# Patient Record
Sex: Male | Born: 1960 | Race: White | Hispanic: No | Marital: Married | Smoking: Never smoker
Health system: Southern US, Community
[De-identification: ages and names within clinical notes are randomized; demographics above are authoritative.]

## PROBLEM LIST (undated history)

## (undated) DIAGNOSIS — Q798 Other congenital malformations of musculoskeletal system: Secondary | ICD-10-CM

## (undated) DIAGNOSIS — E119 Type 2 diabetes mellitus without complications: Secondary | ICD-10-CM

## (undated) DIAGNOSIS — F1910 Other psychoactive substance abuse, uncomplicated: Secondary | ICD-10-CM

## (undated) DIAGNOSIS — M1990 Unspecified osteoarthritis, unspecified site: Secondary | ICD-10-CM

## (undated) DIAGNOSIS — I10 Essential (primary) hypertension: Secondary | ICD-10-CM

## (undated) HISTORY — PX: EYE SURGERY: SHX253

## (undated) HISTORY — PX: COLONOSCOPY: SHX174

## (undated) HISTORY — DX: Other congenital malformations of musculoskeletal system: Q79.8

## (undated) HISTORY — PX: EVALUATION UNDER ANESTHESIA WITH STRABISMUS REPAIR: SHX5619

## (undated) HISTORY — DX: Unspecified osteoarthritis, unspecified site: M19.90

## (undated) HISTORY — PX: POLYPECTOMY: SHX149

## (undated) HISTORY — DX: Other psychoactive substance abuse, uncomplicated: F19.10

## (undated) HISTORY — DX: Essential (primary) hypertension: I10

## (undated) HISTORY — DX: Type 2 diabetes mellitus without complications: E11.9

---

## 1998-11-08 ENCOUNTER — Emergency Department (HOSPITAL_COMMUNITY): Admission: EM | Admit: 1998-11-08 | Discharge: 1998-11-08 | Payer: Self-pay | Admitting: Emergency Medicine

## 1998-11-08 ENCOUNTER — Encounter: Payer: Self-pay | Admitting: Emergency Medicine

## 2014-11-18 ENCOUNTER — Encounter: Payer: Self-pay | Admitting: Internal Medicine

## 2015-01-14 ENCOUNTER — Ambulatory Visit (AMBULATORY_SURGERY_CENTER): Payer: Self-pay | Admitting: *Deleted

## 2015-01-14 VITALS — Ht 67.0 in | Wt 152.0 lb

## 2015-01-14 DIAGNOSIS — Z1211 Encounter for screening for malignant neoplasm of colon: Secondary | ICD-10-CM

## 2015-01-14 MED ORDER — NA SULFATE-K SULFATE-MG SULF 17.5-3.13-1.6 GM/177ML PO SOLN: 1.0000 | Freq: Once | ORAL | Status: DC

## 2015-01-14 NOTE — Progress Notes (Signed)
No egg or soy allergy. No anesthesia problems. Only sedated as child. No Home O2.  No diet meds.

## 2015-01-28 ENCOUNTER — Encounter: Payer: Self-pay | Admitting: Internal Medicine

## 2015-01-28 ENCOUNTER — Ambulatory Visit (AMBULATORY_SURGERY_CENTER): Payer: BLUE CROSS/BLUE SHIELD | Admitting: Internal Medicine

## 2015-01-28 VITALS — BP 166/98 | HR 54 | Temp 96.8°F | Resp 17 | Ht 67.0 in | Wt 152.0 lb

## 2015-01-28 DIAGNOSIS — K635 Polyp of colon: Secondary | ICD-10-CM

## 2015-01-28 DIAGNOSIS — Z1211 Encounter for screening for malignant neoplasm of colon: Secondary | ICD-10-CM | POA: Diagnosis present

## 2015-01-28 DIAGNOSIS — D125 Benign neoplasm of sigmoid colon: Secondary | ICD-10-CM

## 2015-01-28 DIAGNOSIS — D123 Benign neoplasm of transverse colon: Secondary | ICD-10-CM

## 2015-01-28 MED ORDER — SODIUM CHLORIDE 0.9 % IV SOLN: 500.0000 mL | INTRAVENOUS | Status: DC

## 2015-01-28 NOTE — Progress Notes (Signed)
Called to room to assist during endoscopic procedure.  Patient ID and intended procedure confirmed with present staff. Received instructions for my participation in the procedure from the performing physician.  

## 2015-01-28 NOTE — Op Note (Signed)
[facility]  [address] [place], [id number]   COLONOSCOPY PROCEDURE REPORT  PATIENT: Dustin Allen, Dustin Allen  MR#: [id number] BIRTHDATE: 18-Apr-1961 , 53  yrs. old GENDER: male ENDOSCOPIST: Jerene Bears, MD REFERRED BY:W.  Lutricia Feil, M.D. PROCEDURE DATE:  01/28/2015 PROCEDURE:   Colonoscopy, screening and Colonoscopy with cold biopsy polypectomy First Screening Colonoscopy - Avg.  risk and is 50 yrs.  old or older Yes.  Prior Negative Screening - Now for repeat screening. N/A  History of Adenoma - Now for follow-up colonoscopy & has been > or = to 3 yrs.  N/A  Polyps removed today? Yes ASA CLASS:   Class II INDICATIONS:Screening for colonic neoplasia and Colorectal Neoplasm Risk Assessment for this procedure is average risk. MEDICATIONS: Monitored anesthesia care and Propofol 250 mg IV  DESCRIPTION OF PROCEDURE:   After the risks benefits and alternatives of the procedure were thoroughly explained, informed consent was obtained.  The digital rectal exam revealed no rectal mass.   The LB [id number]  endoscope was introduced through the anus and advanced to the cecum, which was identified by both the appendix and ileocecal valve. No adverse events experienced. The quality of the prep was good.  (Suprep was used)  The instrument was then slowly withdrawn as the colon was fully examined. Estimated blood loss is zero unless otherwise noted in this procedure report.  COLON FINDINGS: Two sessile polyps ranging from 2 to 4mm in size were found at the hepatic flexure and in the sigmoid colon. Polypectomies were performed with cold forceps.  The resection was complete, the polyp tissue was completely retrieved and sent to histology.   There was moderate diverticulosis noted in the descending colon and sigmoid colon.  Retroflexed views revealed no abnormalities. The time to cecum = 0.0 Withdrawal time = 11.1   The scope was withdrawn and the procedure  completed. COMPLICATIONS: There were no immediate complications.  ENDOSCOPIC IMPRESSION: 1.   Two sessile polyps ranging from 2 to 85mm in size were found at the hepatic flexure and in the sigmoid colon; polypectomies were performed with cold forceps 2.   Moderate diverticulosis was noted in the descending colon and sigmoid colon  RECOMMENDATIONS: 1.  Await pathology results 2.  High fiber diet 3.  If the polyps removed today are proven to be adenomatous (pre-cancerous) polyps, you will need a repeat colonoscopy in 5 years.  Otherwise you should continue to follow colorectal cancer screening guidelines for "routine risk" patients with colonoscopy in 10 years.  You will receive a letter within 1-2 weeks with the results of your biopsy as well as final recommendations.  Please call my office if you have not received a letter after 3 weeks.  eSigned:  Jerene Bears, MD 01/28/2015 10:47 AM   cc: Janalyn Rouse, MD and The Patient

## 2015-01-28 NOTE — Patient Instructions (Signed)
YOU HAD AN ENDOSCOPIC PROCEDURE TODAY AT [facility]:   Refer to the procedure report that was given to you for any specific questions about what was found during the examination.  If the procedure report does not answer your questions, please call your gastroenterologist to clarify.  If you requested that your care partner not be given the details of your procedure findings, then the procedure report has been included in a sealed envelope for you to review at your convenience later.  YOU SHOULD EXPECT: Some feelings of bloating in the abdomen. Passage of more gas than usual.  Walking can help get rid of the air that was put into your GI tract during the procedure and reduce the bloating. If you had a lower endoscopy (such as a colonoscopy or flexible sigmoidoscopy) you may notice spotting of blood in your stool or on the toilet paper. If you underwent a bowel prep for your procedure, you may not have a normal bowel movement for a few days.  Please Note:  You might notice some irritation and congestion in your nose or some drainage.  This is from the oxygen used during your procedure.  There is no need for concern and it should clear up in a day or so.  SYMPTOMS TO REPORT IMMEDIATELY:   Following lower endoscopy (colonoscopy or flexible sigmoidoscopy):  Excessive amounts of blood in the stool  Significant tenderness or worsening of abdominal pains  Swelling of the abdomen that is new, acute  Fever of 100F or higher   For urgent or emergent issues, a gastroenterologist can be reached at any hour by calling [phone].   DIET: Your first meal following the procedure should be a small meal and then it is ok to progress to your normal diet. Heavy or fried foods are harder to digest and may make you feel nauseous or bloated.  Likewise, meals heavy in dairy and vegetables can increase bloating.  Drink plenty of fluids but you should avoid alcoholic beverages for 24  hours.  ACTIVITY:  You should plan to take it easy for the rest of today and you should NOT DRIVE or use heavy machinery until tomorrow (because of the sedation medicines used during the test).    FOLLOW UP: Our staff will call the number listed on your records the next business day following your procedure to check on you and address any questions or concerns that you may have regarding the information given to you following your procedure. If we do not reach you, we will leave a message.  However, if you are feeling well and you are not experiencing any problems, there is no need to return our call.  We will assume that you have returned to your regular daily activities without incident.  If any biopsies were taken you will be contacted by phone or by letter within the next 1-3 weeks.  Please call us at [phone] if you have not heard about the biopsies in 3 weeks.    SIGNATURES/CONFIDENTIALITY: You and/or your care partner have signed paperwork which will be entered into your electronic medical record.  These signatures attest to the fact that that the information above on your After Visit Summary has been reviewed and is understood.  Full responsibility of the confidentiality of this discharge information lies with you and/or your care-partner.    Handouts were given to your care partner on polyps, diverticulosis, and a high fiber diet with liberal fluid intake. You may resume  current medications today. Await biopsy results. Please call if any questions or concerns.   

## 2015-01-28 NOTE — Progress Notes (Signed)
A/ox3 pleased with MAC, report to Annette RN 

## 2015-01-28 NOTE — Progress Notes (Signed)
No problems noted in the recovery room. maw 

## 2015-01-29 ENCOUNTER — Telehealth: Payer: Self-pay | Admitting: *Deleted

## 2015-01-29 NOTE — Telephone Encounter (Signed)
  Follow up Call-  Call back number 01/28/2015  Post procedure Call Back phone  # [phone]  Permission to leave phone message Yes     Patient questions:  Do you have a fever, pain , or abdominal swelling? No. Pain Score  0 *  Have you tolerated food without any problems? Yes.    Have you been able to return to your normal activities? Yes.    Do you have any questions about your discharge instructions: Diet   No. Medications  No. Follow up visit  No.  Do you have questions or concerns about your Care? No.  Actions: * If pain score is 4 or above: No action needed, pain <4.

## 2015-02-03 ENCOUNTER — Encounter: Payer: Self-pay | Admitting: Internal Medicine

## 2015-10-28 DIAGNOSIS — E784 Other hyperlipidemia: Secondary | ICD-10-CM | POA: Diagnosis not present

## 2015-10-28 DIAGNOSIS — E119 Type 2 diabetes mellitus without complications: Secondary | ICD-10-CM | POA: Diagnosis not present

## 2015-10-28 DIAGNOSIS — Z6823 Body mass index (BMI) 23.0-23.9, adult: Secondary | ICD-10-CM | POA: Diagnosis not present

## 2015-10-28 DIAGNOSIS — I10 Essential (primary) hypertension: Secondary | ICD-10-CM | POA: Diagnosis not present

## 2015-11-13 DIAGNOSIS — L821 Other seborrheic keratosis: Secondary | ICD-10-CM | POA: Diagnosis not present

## 2015-11-13 DIAGNOSIS — D692 Other nonthrombocytopenic purpura: Secondary | ICD-10-CM | POA: Diagnosis not present

## 2015-11-13 DIAGNOSIS — B360 Pityriasis versicolor: Secondary | ICD-10-CM | POA: Diagnosis not present

## 2015-11-13 DIAGNOSIS — D1801 Hemangioma of skin and subcutaneous tissue: Secondary | ICD-10-CM | POA: Diagnosis not present

## 2015-12-05 DIAGNOSIS — Z125 Encounter for screening for malignant neoplasm of prostate: Secondary | ICD-10-CM | POA: Diagnosis not present

## 2015-12-05 DIAGNOSIS — E119 Type 2 diabetes mellitus without complications: Secondary | ICD-10-CM | POA: Diagnosis not present

## 2015-12-05 DIAGNOSIS — E784 Other hyperlipidemia: Secondary | ICD-10-CM | POA: Diagnosis not present

## 2015-12-05 DIAGNOSIS — I10 Essential (primary) hypertension: Secondary | ICD-10-CM | POA: Diagnosis not present

## 2015-12-08 DIAGNOSIS — R5383 Other fatigue: Secondary | ICD-10-CM | POA: Diagnosis not present

## 2016-01-01 DIAGNOSIS — M62831 Muscle spasm of calf: Secondary | ICD-10-CM | POA: Diagnosis not present

## 2016-01-01 DIAGNOSIS — M9906 Segmental and somatic dysfunction of lower extremity: Secondary | ICD-10-CM | POA: Diagnosis not present

## 2016-01-01 DIAGNOSIS — S86012A Strain of left Achilles tendon, initial encounter: Secondary | ICD-10-CM | POA: Diagnosis not present

## 2016-01-30 DIAGNOSIS — I10 Essential (primary) hypertension: Secondary | ICD-10-CM | POA: Diagnosis not present

## 2016-04-28 DIAGNOSIS — E1129 Type 2 diabetes mellitus with other diabetic kidney complication: Secondary | ICD-10-CM | POA: Diagnosis not present

## 2016-04-28 DIAGNOSIS — R808 Other proteinuria: Secondary | ICD-10-CM | POA: Diagnosis not present

## 2016-04-28 DIAGNOSIS — E784 Other hyperlipidemia: Secondary | ICD-10-CM | POA: Diagnosis not present

## 2016-04-28 DIAGNOSIS — I10 Essential (primary) hypertension: Secondary | ICD-10-CM | POA: Diagnosis not present

## 2016-04-28 DIAGNOSIS — Z23 Encounter for immunization: Secondary | ICD-10-CM | POA: Diagnosis not present

## 2016-09-01 DIAGNOSIS — E784 Other hyperlipidemia: Secondary | ICD-10-CM | POA: Diagnosis not present

## 2016-09-01 DIAGNOSIS — E1129 Type 2 diabetes mellitus with other diabetic kidney complication: Secondary | ICD-10-CM | POA: Diagnosis not present

## 2016-09-01 DIAGNOSIS — I10 Essential (primary) hypertension: Secondary | ICD-10-CM | POA: Diagnosis not present

## 2016-09-01 DIAGNOSIS — R808 Other proteinuria: Secondary | ICD-10-CM | POA: Diagnosis not present

## 2016-12-30 DIAGNOSIS — C44622 Squamous cell carcinoma of skin of right upper limb, including shoulder: Secondary | ICD-10-CM | POA: Diagnosis not present

## 2016-12-30 DIAGNOSIS — L821 Other seborrheic keratosis: Secondary | ICD-10-CM | POA: Diagnosis not present

## 2016-12-30 DIAGNOSIS — L812 Freckles: Secondary | ICD-10-CM | POA: Diagnosis not present

## 2017-02-17 ENCOUNTER — Emergency Department (HOSPITAL_COMMUNITY)
Admission: EM | Admit: 2017-02-17 | Discharge: 2017-02-17 | Disposition: A | Discharge status: Home / Self Care | Payer: BLUE CROSS/BLUE SHIELD | Attending: Emergency Medicine | Admitting: Emergency Medicine

## 2017-02-17 ENCOUNTER — Encounter (HOSPITAL_COMMUNITY): Payer: Self-pay | Admitting: Emergency Medicine

## 2017-02-17 ENCOUNTER — Emergency Department (HOSPITAL_COMMUNITY): Payer: BLUE CROSS/BLUE SHIELD

## 2017-02-17 DIAGNOSIS — Z7982 Long term (current) use of aspirin: Secondary | ICD-10-CM | POA: Diagnosis not present

## 2017-02-17 DIAGNOSIS — S99911A Unspecified injury of right ankle, initial encounter: Secondary | ICD-10-CM | POA: Diagnosis not present

## 2017-02-17 DIAGNOSIS — Z7984 Long term (current) use of oral hypoglycemic drugs: Secondary | ICD-10-CM | POA: Insufficient documentation

## 2017-02-17 DIAGNOSIS — Z79899 Other long term (current) drug therapy: Secondary | ICD-10-CM | POA: Insufficient documentation

## 2017-02-17 DIAGNOSIS — Z23 Encounter for immunization: Secondary | ICD-10-CM | POA: Diagnosis not present

## 2017-02-17 DIAGNOSIS — R079 Chest pain, unspecified: Secondary | ICD-10-CM | POA: Diagnosis not present

## 2017-02-17 DIAGNOSIS — M47812 Spondylosis without myelopathy or radiculopathy, cervical region: Secondary | ICD-10-CM | POA: Diagnosis not present

## 2017-02-17 DIAGNOSIS — I10 Essential (primary) hypertension: Secondary | ICD-10-CM | POA: Diagnosis not present

## 2017-02-17 DIAGNOSIS — R0789 Other chest pain: Secondary | ICD-10-CM | POA: Insufficient documentation

## 2017-02-17 DIAGNOSIS — F17220 Nicotine dependence, chewing tobacco, uncomplicated: Secondary | ICD-10-CM | POA: Diagnosis not present

## 2017-02-17 DIAGNOSIS — S99921A Unspecified injury of right foot, initial encounter: Secondary | ICD-10-CM | POA: Diagnosis not present

## 2017-02-17 DIAGNOSIS — R0781 Pleurodynia: Secondary | ICD-10-CM | POA: Diagnosis not present

## 2017-02-17 DIAGNOSIS — Z043 Encounter for examination and observation following other accident: Secondary | ICD-10-CM | POA: Diagnosis not present

## 2017-02-17 DIAGNOSIS — E119 Type 2 diabetes mellitus without complications: Secondary | ICD-10-CM | POA: Insufficient documentation

## 2017-02-17 MED ORDER — TETANUS-DIPHTH-ACELL PERTUSSIS 5-2.5-18.5 LF-MCG/0.5 IM SUSP
0.5000 mL | Freq: Once | INTRAMUSCULAR | Status: AC
  Administered 2017-02-17: 0.5 mL via INTRAMUSCULAR
  Filled 2017-02-17: qty 0.5
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
## 2017-02-17 NOTE — Discharge Instructions (Signed)
Take tylenol, motrin for pain.   Expect to be stiff and sore tomorrow.   See your doctor.   Return to ER if you have headaches, chest pain, rib pain, trouble breathing, foot pain
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
## 2017-02-17 NOTE — ED Notes (Signed)
Patient transported to X-ray 

## 2017-02-22 DIAGNOSIS — Z6827 Body mass index (BMI) 27.0-27.9, adult: Secondary | ICD-10-CM | POA: Diagnosis not present

## 2017-02-22 DIAGNOSIS — L237 Allergic contact dermatitis due to plants, except food: Secondary | ICD-10-CM | POA: Diagnosis not present

## 2017-02-22 DIAGNOSIS — Z23 Encounter for immunization: Secondary | ICD-10-CM | POA: Diagnosis not present

## 2017-02-22 DIAGNOSIS — R0781 Pleurodynia: Secondary | ICD-10-CM | POA: Diagnosis not present

## 2017-03-28 DIAGNOSIS — L603 Nail dystrophy: Secondary | ICD-10-CM | POA: Diagnosis not present

## 2017-03-28 DIAGNOSIS — Z85828 Personal history of other malignant neoplasm of skin: Secondary | ICD-10-CM | POA: Diagnosis not present

## 2017-05-18 DIAGNOSIS — B351 Tinea unguium: Secondary | ICD-10-CM | POA: Diagnosis not present

## 2017-05-18 DIAGNOSIS — Z79899 Other long term (current) drug therapy: Secondary | ICD-10-CM | POA: Diagnosis not present

## 2017-07-10 DIAGNOSIS — J069 Acute upper respiratory infection, unspecified: Secondary | ICD-10-CM | POA: Diagnosis not present

## 2017-07-10 DIAGNOSIS — E119 Type 2 diabetes mellitus without complications: Secondary | ICD-10-CM | POA: Diagnosis not present

## 2017-07-10 DIAGNOSIS — H109 Unspecified conjunctivitis: Secondary | ICD-10-CM | POA: Diagnosis not present

## 2017-08-09 DIAGNOSIS — R5383 Other fatigue: Secondary | ICD-10-CM | POA: Diagnosis not present

## 2017-08-09 DIAGNOSIS — E1129 Type 2 diabetes mellitus with other diabetic kidney complication: Secondary | ICD-10-CM | POA: Diagnosis not present

## 2017-08-09 DIAGNOSIS — E7849 Other hyperlipidemia: Secondary | ICD-10-CM | POA: Diagnosis not present

## 2017-08-09 DIAGNOSIS — I10 Essential (primary) hypertension: Secondary | ICD-10-CM | POA: Diagnosis not present

## 2017-08-15 DIAGNOSIS — E1129 Type 2 diabetes mellitus with other diabetic kidney complication: Secondary | ICD-10-CM | POA: Diagnosis not present

## 2017-08-15 DIAGNOSIS — I10 Essential (primary) hypertension: Secondary | ICD-10-CM | POA: Diagnosis not present

## 2017-08-15 DIAGNOSIS — E7849 Other hyperlipidemia: Secondary | ICD-10-CM | POA: Diagnosis not present

## 2017-08-15 DIAGNOSIS — Z0000 Encounter for general adult medical examination without abnormal findings: Secondary | ICD-10-CM | POA: Diagnosis not present

## 2017-08-15 DIAGNOSIS — R808 Other proteinuria: Secondary | ICD-10-CM | POA: Diagnosis not present

## 2017-09-27 DIAGNOSIS — S61431A Puncture wound without foreign body of right hand, initial encounter: Secondary | ICD-10-CM | POA: Diagnosis not present

## 2017-09-27 DIAGNOSIS — W540XXA Bitten by dog, initial encounter: Secondary | ICD-10-CM | POA: Diagnosis not present

## 2017-11-16 DIAGNOSIS — F430 Acute stress reaction: Secondary | ICD-10-CM | POA: Diagnosis not present

## 2017-11-16 DIAGNOSIS — F1020 Alcohol dependence, uncomplicated: Secondary | ICD-10-CM | POA: Diagnosis not present

## 2017-12-27 DIAGNOSIS — F1020 Alcohol dependence, uncomplicated: Secondary | ICD-10-CM | POA: Diagnosis not present

## 2017-12-27 DIAGNOSIS — F430 Acute stress reaction: Secondary | ICD-10-CM | POA: Diagnosis not present

## 2018-01-02 DIAGNOSIS — L812 Freckles: Secondary | ICD-10-CM | POA: Diagnosis not present

## 2018-01-02 DIAGNOSIS — B360 Pityriasis versicolor: Secondary | ICD-10-CM | POA: Diagnosis not present

## 2018-01-02 DIAGNOSIS — L821 Other seborrheic keratosis: Secondary | ICD-10-CM | POA: Diagnosis not present

## 2018-01-02 DIAGNOSIS — Z85828 Personal history of other malignant neoplasm of skin: Secondary | ICD-10-CM | POA: Diagnosis not present

## 2018-02-08 DIAGNOSIS — E1129 Type 2 diabetes mellitus with other diabetic kidney complication: Secondary | ICD-10-CM | POA: Diagnosis not present

## 2018-02-08 DIAGNOSIS — I10 Essential (primary) hypertension: Secondary | ICD-10-CM | POA: Diagnosis not present

## 2018-02-08 DIAGNOSIS — F1020 Alcohol dependence, uncomplicated: Secondary | ICD-10-CM | POA: Diagnosis not present

## 2018-02-08 DIAGNOSIS — Z23 Encounter for immunization: Secondary | ICD-10-CM | POA: Diagnosis not present

## 2018-02-08 DIAGNOSIS — E7849 Other hyperlipidemia: Secondary | ICD-10-CM | POA: Diagnosis not present

## 2018-03-14 DIAGNOSIS — L820 Inflamed seborrheic keratosis: Secondary | ICD-10-CM | POA: Diagnosis not present

## 2018-11-24 DIAGNOSIS — Z03818 Encounter for observation for suspected exposure to other biological agents ruled out: Secondary | ICD-10-CM | POA: Diagnosis not present

## 2019-01-04 DIAGNOSIS — L812 Freckles: Secondary | ICD-10-CM | POA: Diagnosis not present

## 2019-01-04 DIAGNOSIS — Z85828 Personal history of other malignant neoplasm of skin: Secondary | ICD-10-CM | POA: Diagnosis not present

## 2019-01-04 DIAGNOSIS — L820 Inflamed seborrheic keratosis: Secondary | ICD-10-CM | POA: Diagnosis not present

## 2019-01-04 DIAGNOSIS — L821 Other seborrheic keratosis: Secondary | ICD-10-CM | POA: Diagnosis not present

## 2019-01-04 DIAGNOSIS — B360 Pityriasis versicolor: Secondary | ICD-10-CM | POA: Diagnosis not present

## 2019-02-22 DIAGNOSIS — Z125 Encounter for screening for malignant neoplasm of prostate: Secondary | ICD-10-CM | POA: Diagnosis not present

## 2019-02-22 DIAGNOSIS — E1129 Type 2 diabetes mellitus with other diabetic kidney complication: Secondary | ICD-10-CM | POA: Diagnosis not present

## 2019-02-22 DIAGNOSIS — Z0000 Encounter for general adult medical examination without abnormal findings: Secondary | ICD-10-CM | POA: Diagnosis not present

## 2019-03-01 DIAGNOSIS — E1129 Type 2 diabetes mellitus with other diabetic kidney complication: Secondary | ICD-10-CM | POA: Diagnosis not present

## 2019-03-01 DIAGNOSIS — Z1331 Encounter for screening for depression: Secondary | ICD-10-CM | POA: Diagnosis not present

## 2019-03-01 DIAGNOSIS — E785 Hyperlipidemia, unspecified: Secondary | ICD-10-CM | POA: Diagnosis not present

## 2019-03-01 DIAGNOSIS — I129 Hypertensive chronic kidney disease with stage 1 through stage 4 chronic kidney disease, or unspecified chronic kidney disease: Secondary | ICD-10-CM | POA: Diagnosis not present

## 2019-03-01 DIAGNOSIS — Z0000 Encounter for general adult medical examination without abnormal findings: Secondary | ICD-10-CM | POA: Diagnosis not present

## 2019-03-01 DIAGNOSIS — N183 Chronic kidney disease, stage 3 (moderate): Secondary | ICD-10-CM | POA: Diagnosis not present

## 2019-07-09 DIAGNOSIS — D1801 Hemangioma of skin and subcutaneous tissue: Secondary | ICD-10-CM | POA: Diagnosis not present

## 2019-07-09 DIAGNOSIS — L812 Freckles: Secondary | ICD-10-CM | POA: Diagnosis not present

## 2019-07-09 DIAGNOSIS — L570 Actinic keratosis: Secondary | ICD-10-CM | POA: Diagnosis not present

## 2019-07-09 DIAGNOSIS — Z85828 Personal history of other malignant neoplasm of skin: Secondary | ICD-10-CM | POA: Diagnosis not present

## 2019-07-09 DIAGNOSIS — L821 Other seborrheic keratosis: Secondary | ICD-10-CM | POA: Diagnosis not present

## 2019-08-29 DIAGNOSIS — E1129 Type 2 diabetes mellitus with other diabetic kidney complication: Secondary | ICD-10-CM | POA: Diagnosis not present

## 2019-08-29 DIAGNOSIS — I10 Essential (primary) hypertension: Secondary | ICD-10-CM | POA: Diagnosis not present

## 2019-08-29 DIAGNOSIS — N1831 Chronic kidney disease, stage 3a: Secondary | ICD-10-CM | POA: Diagnosis not present

## 2019-08-29 DIAGNOSIS — E785 Hyperlipidemia, unspecified: Secondary | ICD-10-CM | POA: Diagnosis not present

## 2019-12-31 DIAGNOSIS — L812 Freckles: Secondary | ICD-10-CM | POA: Diagnosis not present

## 2019-12-31 DIAGNOSIS — D1801 Hemangioma of skin and subcutaneous tissue: Secondary | ICD-10-CM | POA: Diagnosis not present

## 2019-12-31 DIAGNOSIS — L820 Inflamed seborrheic keratosis: Secondary | ICD-10-CM | POA: Diagnosis not present

## 2019-12-31 DIAGNOSIS — D225 Melanocytic nevi of trunk: Secondary | ICD-10-CM | POA: Diagnosis not present

## 2019-12-31 DIAGNOSIS — Z85828 Personal history of other malignant neoplasm of skin: Secondary | ICD-10-CM | POA: Diagnosis not present

## 2020-04-02 DIAGNOSIS — F1020 Alcohol dependence, uncomplicated: Secondary | ICD-10-CM | POA: Diagnosis not present

## 2020-04-16 DIAGNOSIS — F1020 Alcohol dependence, uncomplicated: Secondary | ICD-10-CM | POA: Diagnosis not present

## 2020-04-30 DIAGNOSIS — F1020 Alcohol dependence, uncomplicated: Secondary | ICD-10-CM | POA: Diagnosis not present

## 2020-05-14 DIAGNOSIS — F1020 Alcohol dependence, uncomplicated: Secondary | ICD-10-CM | POA: Diagnosis not present

## 2020-06-03 ENCOUNTER — Other Ambulatory Visit: Payer: Self-pay

## 2020-06-03 ENCOUNTER — Encounter (HOSPITAL_BASED_OUTPATIENT_CLINIC_OR_DEPARTMENT_OTHER): Payer: Self-pay | Admitting: *Deleted

## 2020-06-03 ENCOUNTER — Other Ambulatory Visit (HOSPITAL_COMMUNITY): Payer: Self-pay | Admitting: Family

## 2020-06-03 ENCOUNTER — Emergency Department (HOSPITAL_BASED_OUTPATIENT_CLINIC_OR_DEPARTMENT_OTHER)
Admission: EM | Admit: 2020-06-03 | Discharge: 2020-06-03 | Disposition: A | Discharge status: Home / Self Care | Payer: BC Managed Care – PPO | Attending: Emergency Medicine | Admitting: Emergency Medicine

## 2020-06-03 DIAGNOSIS — R059 Cough, unspecified: Secondary | ICD-10-CM | POA: Diagnosis not present

## 2020-06-03 DIAGNOSIS — I10 Essential (primary) hypertension: Secondary | ICD-10-CM | POA: Diagnosis not present

## 2020-06-03 DIAGNOSIS — Z20822 Contact with and (suspected) exposure to covid-19: Secondary | ICD-10-CM | POA: Diagnosis not present

## 2020-06-03 DIAGNOSIS — R509 Fever, unspecified: Secondary | ICD-10-CM | POA: Diagnosis not present

## 2020-06-03 DIAGNOSIS — F17220 Nicotine dependence, chewing tobacco, uncomplicated: Secondary | ICD-10-CM | POA: Diagnosis not present

## 2020-06-03 DIAGNOSIS — U071 COVID-19: Secondary | ICD-10-CM

## 2020-06-03 DIAGNOSIS — Z7982 Long term (current) use of aspirin: Secondary | ICD-10-CM | POA: Diagnosis not present

## 2020-06-03 DIAGNOSIS — Z79899 Other long term (current) drug therapy: Secondary | ICD-10-CM | POA: Diagnosis not present

## 2020-06-03 DIAGNOSIS — E119 Type 2 diabetes mellitus without complications: Secondary | ICD-10-CM | POA: Insufficient documentation

## 2020-06-03 DIAGNOSIS — Z7984 Long term (current) use of oral hypoglycemic drugs: Secondary | ICD-10-CM | POA: Diagnosis not present

## 2020-06-03 DIAGNOSIS — R0989 Other specified symptoms and signs involving the circulatory and respiratory systems: Secondary | ICD-10-CM | POA: Insufficient documentation

## 2020-06-03 LAB — RESP PANEL BY RT-PCR (FLU A&B, COVID) ARPGX2
Influenza A by PCR: NEGATIVE
Influenza B by PCR: NEGATIVE
SARS Coronavirus 2 by RT PCR: POSITIVE — AB

## 2020-06-03 NOTE — Discharge Instructions (Signed)
We swabbed you for Covid today and you should get your results later this evening or tomorrow.  If you are positive for Covid you will need to stay home for 10 days.  Since you are already on the list for monoclonal antibodies, you should consider getting them once you have an appointment.  Please monitor your oxygen and return if oxygen is less than 90%.  See your doctor for follow-up.  You can take Tylenol or Motrin for fevers.  Expect fevers and chills and cough and myalgias.  Return to ER if you have trouble breathing or your oxygen is less than 90%, dehydration     Person Under Monitoring Name: Dustin Allen  Location: [place] [address]   Infection Prevention Recommendations for Individuals Confirmed to have, or Being Evaluated for, 2019 Novel Coronavirus (COVID-19) Infection Who Receive Care at Home  Individuals who are confirmed to have, or are being evaluated for, COVID-19 should follow the prevention steps below until a healthcare provider or local or state health department says they can return to normal activities.  Stay home except to get medical care You should restrict activities outside your home, except for getting medical care. Do not go to work, school, or public areas, and do not use public transportation or taxis.  Call ahead before visiting your doctor Before your medical appointment, call the healthcare provider and tell them that you have, or are being evaluated for, COVID-19 infection. This will help the healthcare provider's office take steps to keep other people from getting infected. Ask your healthcare provider to call the local or state health department.  Monitor your symptoms Seek prompt medical attention if your illness is worsening (e.g., difficulty breathing). Before going to your medical appointment, call the healthcare provider and tell them that you have, or are being evaluated for, COVID-19 infection.  Ask your healthcare provider to call the local or state health department.  Wear a facemask You should wear a facemask that covers your nose and mouth when you are in the same room with other people and when you visit a healthcare provider. People who live with or visit you should also wear a facemask while they are in the same room with you.  Separate yourself from other people in your home As much as possible, you should stay in a different room from other people in your home. Also, you should use a separate bathroom, if available.  Avoid sharing household items You should not share dishes, drinking glasses, cups, eating utensils, towels, bedding, or other items with other people in your home. After using these items, you should wash them thoroughly with soap and water.  Cover your coughs and sneezes Cover your mouth and nose with a tissue when you cough or sneeze, or you can cough or sneeze into your sleeve. Throw used tissues in a lined trash can, and immediately wash your hands with soap and water for at least 20 seconds or use an alcohol-based hand rub.  Wash your [facility] your hands often and thoroughly with soap and water for at least 20 seconds. You can use an alcohol-based hand sanitizer if soap and water are not available and if your hands are not visibly dirty. Avoid touching your eyes, nose, and mouth with unwashed hands.   Prevention Steps for Caregivers and Household Members of Individuals Confirmed to have, or Being Evaluated for, COVID-19 Infection Being Cared for in the Home  If you live with, or  provide care at home for, a person confirmed to have, or being evaluated for, COVID-19 infection please follow these guidelines to prevent infection:  Follow healthcare provider's instructions Make sure that you understand and can help the patient follow any healthcare provider instructions for all care.  Provide for the patient's basic needs You should help the  patient with basic needs in the home and provide support for getting groceries, prescriptions, and other personal needs.  Monitor the patient's symptoms If they are getting sicker, call his or her medical provider and tell them that the patient has, or is being evaluated for, COVID-19 infection. This will help the healthcare provider's office take steps to keep other people from getting infected. Ask the healthcare provider to call the local or state health department.  Limit the number of people who have contact with the patient If possible, have only one caregiver for the patient. Other household members should stay in another home or place of residence. If this is not possible, they should stay in another room, or be separated from the patient as much as possible. Use a separate bathroom, if available. Restrict visitors who do not have an essential need to be in the home.  Keep older adults, very young children, and other sick people away from the patient Keep older adults, very young children, and those who have compromised immune systems or chronic health conditions away from the patient. This includes people with chronic heart, lung, or kidney conditions, diabetes, and cancer.  Ensure good ventilation Make sure that shared spaces in the home have good air flow, such as from an air conditioner or an opened window, weather permitting.  Wash your hands often Wash your hands often and thoroughly with soap and water for at least 20 seconds. You can use an alcohol based hand sanitizer if soap and water are not available and if your hands are not visibly dirty. Avoid touching your eyes, nose, and mouth with unwashed hands. Use disposable paper towels to dry your hands. If not available, use dedicated cloth towels and replace them when they become wet.  Wear a facemask and gloves Wear a disposable facemask at all times in the room and gloves when you touch or have contact with the patient's  blood, body fluids, and/or secretions or excretions, such as sweat, saliva, sputum, nasal mucus, vomit, urine, or feces.  Ensure the mask fits over your nose and mouth tightly, and do not touch it during use. Throw out disposable facemasks and gloves after using them. Do not reuse. Wash your hands immediately after removing your facemask and gloves. If your personal clothing becomes contaminated, carefully remove clothing and launder. Wash your hands after handling contaminated clothing. Place all used disposable facemasks, gloves, and other waste in a lined container before disposing them with other household waste. Remove gloves and wash your hands immediately after handling these items.  Do not share dishes, glasses, or other household items with the patient Avoid sharing household items. You should not share dishes, drinking glasses, cups, eating utensils, towels, bedding, or other items with a patient who is confirmed to have, or being evaluated for, COVID-19 infection. After the person uses these items, you should wash them thoroughly with soap and water.  Wash laundry thoroughly Immediately remove and wash clothes or bedding that have blood, body fluids, and/or secretions or excretions, such as sweat, saliva, sputum, nasal mucus, vomit, urine, or feces, on them. Wear gloves when handling laundry from the patient. Read and follow  directions on labels of laundry or clothing items and detergent. In general, wash and dry with the warmest temperatures recommended on the label.  Clean all areas the individual has used often Clean all touchable surfaces, such as counters, tabletops, doorknobs, bathroom fixtures, toilets, phones, keyboards, tablets, and bedside tables, every day. Also, clean any surfaces that may have blood, body fluids, and/or secretions or excretions on them. Wear gloves when cleaning surfaces the patient has come in contact with. Use a diluted bleach solution (e.g., dilute  bleach with 1 part bleach and 10 parts water) or a household disinfectant with a label that says EPA-registered for coronaviruses. To make a bleach solution at home, add 1 tablespoon of bleach to 1 quart (4 cups) of water. For a larger supply, add  cup of bleach to 1 gallon (16 cups) of water. Read labels of cleaning products and follow recommendations provided on product labels. Labels contain instructions for safe and effective use of the cleaning product including precautions you should take when applying the product, such as wearing gloves or eye protection and making sure you have good ventilation during use of the product. Remove gloves and wash hands immediately after cleaning.  Monitor yourself for signs and symptoms of illness Caregivers and household members are considered close contacts, should monitor their health, and will be asked to limit movement outside of the home to the extent possible. Follow the monitoring steps for close contacts listed on the symptom monitoring form.   ? If you have additional questions, contact your local health department or call the epidemiologist on call at [phone] (available 24/7). ? This guidance is subject to change. For the most up-to-date guidance from [facility], please refer to their website: [email or web address]

## 2020-06-03 NOTE — ED Provider Notes (Signed)
[facility] EMERGENCY DEPARTMENT Provider Note   CSN: [id number] Arrival date & time: 06/03/20  1655     History No chief complaint on file.   Dustin Allen is a 59 y.o. male history of diabetes, hypertension, here presenting with fever and cough.  Patient states that he was traveling to multiple cities and got on multiple flights last week.  He states that he was wearing his mask all the whole time but unclear if any of the passengers had Covid.  He has some congestion and cough for several days.  He started running a fever 103 earlier today.  He did home antigen tests and had 2 positive tests at home.  Patient called his primary care doctor and was sent here for further evaluation.  Of note, patient did receive 2 doses of his Pfizer vaccine but not the booster.  He is already on the list for the monoclonal antibody infusion.  The history is provided by the patient.       Past Medical History:  Diagnosis Date  . Congenital absence of pectoral muscle    right   . Diabetes mellitus without complication (HCC)    past dx 07-no meds-lost alot of weight   . Hypertension   . Substance abuse (HCC)    remission since 10-2004    There are no problems to display for this patient.   Past Surgical History:  Procedure Laterality Date  . EVALUATION UNDER ANESTHESIA WITH STRABISMUS REPAIR     as a child       Family History  Problem Relation Age of Onset  . Bipolar disorder Brother        deceased  . Prostate cancer Father   . Hypertension Mother   . Hyperlipidemia Mother   . Colon cancer Neg Hx     Social History   Tobacco Use  . Smoking status: Never Smoker  . Smokeless tobacco: Current User    Types: Snuff  Substance Use Topics  . Alcohol use: No    Alcohol/week: 0.0 standard drinks  . Drug use: No    Home Medications Prior to Admission medications   Medication Sig Start Date End Date Taking? Authorizing Provider  aspirin 81 MG chewable tablet Chew 81  mg by mouth daily.    [provider]  atorvastatin (LIPITOR) 20 MG tablet Take 20 mg by mouth at bedtime.    [provider]  lisinopril (PRINIVIL,ZESTRIL) 5 MG tablet Take 5 mg by mouth daily.    [provider]  metFORMIN (GLUCOPHAGE) 1000 MG tablet Take 1,000 mg by mouth 2 (two) times daily with a meal.    [provider]  Multiple Vitamins-Minerals (MULTIVITAMIN ADULT) TABS Take 1 tablet by mouth daily.     [provider]    Allergies    Patient has no known allergies.  Review of Systems   Review of Systems  Constitutional: Positive for fever.  Respiratory: Positive for cough.   All other systems reviewed and are negative.   Physical Exam Updated Vital Signs BP (!) 159/99   Pulse 94   Temp 97.8 F (36.6 C) (Oral)   Resp 18   Ht 5\' 7"  (1.702 m)   Wt 77.1 kg   SpO2 96%   BMI 26.63 kg/m   Physical Exam Vitals and nursing note reviewed.  HENT:     Head: Normocephalic.     Mouth/Throat:     Mouth: Mucous membranes are moist.  Eyes:  Extraocular Movements: Extraocular movements intact.     Pupils: Pupils are equal, round, and reactive to light.  Cardiovascular:     Rate and Rhythm: Normal rate and regular rhythm.     Pulses: Normal pulses.     Heart sounds: Normal heart sounds.  Pulmonary:     Effort: Pulmonary effort is normal.     Breath sounds: Normal breath sounds.  Abdominal:     General: Abdomen is flat.     Palpations: Abdomen is soft.  Musculoskeletal:        General: Normal range of motion.     Cervical back: Normal range of motion.  Skin:    General: Skin is warm.     Capillary Refill: Capillary refill takes less than 2 seconds.  Neurological:     General: No focal deficit present.     Mental Status: He is alert.  Psychiatric:        Mood and Affect: Mood normal.        Behavior: Behavior normal.     ED Results / Procedures / Treatments   Labs (all labs ordered are listed, but only abnormal  results are displayed) Labs Reviewed  RESP PANEL BY RT-PCR (FLU A&B, COVID) [id number]    EKG None  Radiology No results found.  Procedures Procedures (including critical care time)  Medications Ordered in ED Medications - No data to display  ED Course  I have reviewed the triage vital signs and the nursing notes.  Pertinent labs & imaging results that were available during my care of the patient were reviewed by me and considered in my medical decision making (see chart for details).    MDM Rules/Calculators/A&P                         Dustin Allen is a 59 y.o. male here presenting with possible Covid.  Patient is afebrile in the ED and not hypoxic.  Patient has no underlying lung problems.  He is also fully vaccinated.  Patient appears well.  Patient has 2 home test that are positive.  Will do Covid PCR test and told him to stay home for 10 days if positive.  Told him to monitor his oxygen level at home.  Stable for discharge   Final Clinical Impression(s) / ED Diagnoses Final diagnoses:  Encounter for laboratory testing for COVID-19 virus    Rx / DC Orders ED Discharge Orders    None       Drenda Freeze, MD 06/03/20 1722

## 2020-06-03 NOTE — Progress Notes (Signed)
I connected by phone with Dustin Allen on 06/03/2020 at 6:35 PM to discuss the potential use of a new treatment for mild to moderate COVID-19 viral infection in non-hospitalized patients.  This patient is a 59 y.o. male that meets the FDA criteria for Emergency Use Authorization of COVID monoclonal antibody casirivimab/imdevimab, bamlanivimab/etesevimab, or sotrovimab.  Has a (+) direct SARS-CoV-2 viral test result  Has mild or moderate COVID-19   Is NOT hospitalized due to COVID-19  Is within 10 days of symptom onset  Has at least one of the high risk factor(s) for progression to severe COVID-19 and/or hospitalization as defined in EUA.  Specific high risk criteria : Diabetes and Cardiovascular disease or hypertension   Symptoms of fever, cough, sweats began 06/01/20.   I have spoken and communicated the following to the patient or parent/caregiver regarding COVID monoclonal antibody treatment:  1. FDA has authorized the emergency use for the treatment of mild to moderate COVID-19 in adults and pediatric patients with positive results of direct SARS-CoV-2 viral testing who are 68 years of age and older weighing at least 40 kg, and who are at high risk for progressing to severe COVID-19 and/or hospitalization.  2. The significant known and potential risks and benefits of COVID monoclonal antibody, and the extent to which such potential risks and benefits are unknown.  3. Information on available alternative treatments and the risks and benefits of those alternatives, including clinical trials.  4. Patients treated with COVID monoclonal antibody should continue to self-isolate and use infection control measures (e.g., wear mask, isolate, social distance, avoid sharing personal items, clean and disinfect "high touch" surfaces, and frequent handwashing) according to CDC guidelines.   5. The patient or parent/caregiver has the option to accept or refuse COVID monoclonal antibody  treatment.  After reviewing this information with the patient, the patient has agreed to receive one of the available covid 19 monoclonal antibodies and will be provided an appropriate fact sheet prior to infusion. [name], NP 06/03/2020 6:35 PM

## 2020-06-03 NOTE — ED Triage Notes (Signed)
C/o + covid ,home test x 2 , " want checked out" no complaints

## 2020-06-04 ENCOUNTER — Ambulatory Visit (HOSPITAL_COMMUNITY)
Admission: RE | Admit: 2020-06-04 | Discharge: 2020-06-04 | Disposition: A | Discharge status: Home / Self Care | Payer: BC Managed Care – PPO | Source: Ambulatory Visit | Attending: Pulmonary Disease | Admitting: Pulmonary Disease

## 2020-06-04 DIAGNOSIS — U071 COVID-19: Secondary | ICD-10-CM | POA: Insufficient documentation

## 2020-06-04 MED ORDER — EPINEPHRINE 0.3 MG/0.3ML IJ SOAJ: 0.3000 mg | Freq: Once | INTRAMUSCULAR | Status: DC | PRN

## 2020-06-04 MED ORDER — METHYLPREDNISOLONE SODIUM SUCC 125 MG IJ SOLR: 125.0000 mg | Freq: Once | INTRAMUSCULAR | Status: DC | PRN

## 2020-06-04 MED ORDER — ALBUTEROL SULFATE HFA 108 (90 BASE) MCG/ACT IN AERS: 2.0000 | INHALATION_SPRAY | Freq: Once | RESPIRATORY_TRACT | Status: DC | PRN

## 2020-06-04 MED ORDER — SODIUM CHLORIDE 0.9 % IV SOLN: INTRAVENOUS | Status: DC | PRN

## 2020-06-04 MED ORDER — FAMOTIDINE IN NACL 20-0.9 MG/50ML-% IV SOLN: 20.0000 mg | Freq: Once | INTRAVENOUS | Status: DC | PRN

## 2020-06-04 MED ORDER — DIPHENHYDRAMINE HCL 50 MG/ML IJ SOLN: 50.0000 mg | Freq: Once | INTRAMUSCULAR | Status: DC | PRN

## 2020-06-04 MED ORDER — SODIUM CHLORIDE 0.9 % IV SOLN: Freq: Once | INTRAVENOUS | Status: AC

## 2020-06-04 NOTE — Progress Notes (Signed)
Patient reviewed Fact Sheet for Patients, Parents, and Caregivers for Emergency Use Authorization (EUA) of bamlanivimab and etesevimab for the Treatment of Coronavirus. Patient also reviewed and is agreeable to the estimated cost of treatment. Patient is agreeable to proceed.   

## 2020-06-04 NOTE — Progress Notes (Signed)
  Diagnosis: COVID-19 ° °Physician: Dr. Wright  ° °Procedure: Covid Infusion Clinic Med: bamlanivimab\etesevimab infusion - Provided patient with bamlanimivab\etesevimab fact sheet for patients, parents and caregivers prior to infusion. ° °Complications: No immediate complications noted. ° °Discharge: Discharged home  ° °Dustin Allen  B Timi Reeser °06/04/2020 ° ° °

## 2020-06-04 NOTE — Discharge Instructions (Signed)
10 Things You Can Do to Manage Your COVID-19 Symptoms at Home If you have possible or confirmed COVID-19: 1. Stay home from work and school. And stay away from other public places. If you must go out, avoid using any kind of public transportation, ridesharing, or taxis. 2. Monitor your symptoms carefully. If your symptoms get worse, call your healthcare provider immediately. 3. Get rest and stay hydrated. 4. If you have a medical appointment, call the healthcare provider ahead of time and tell them that you have or may have COVID-19. 5. For medical emergencies, call 911 and notify the dispatch personnel that you have or may have COVID-19. 6. Cover your cough and sneezes with a tissue or use the inside of your elbow. 7. Wash your hands often with soap and water for at least 20 seconds or clean your hands with an alcohol-based hand sanitizer that contains at least 60% alcohol. 8. As much as possible, stay in a specific room and away from other people in your home. Also, you should use a separate bathroom, if available. If you need to be around other people in or outside of the home, wear a mask. 9. Avoid sharing personal items with other people in your household, like dishes, towels, and bedding. 10. Clean all surfaces that are touched often, like counters, tabletops, and doorknobs. Use household cleaning sprays or wipes according to the label instructions. cdc.gov/coronavirus 12/13/2018 This information is not intended to replace advice given to you by your health care provider. Make sure you discuss any questions you have with your health care provider. Document Revised: 05/17/2019 Document Reviewed: 05/17/2019 Elsevier Patient Education  2020 Elsevier Inc. What types of side effects do monoclonal antibody drugs cause?  Common side effects  In general, the more common side effects caused by monoclonal antibody drugs include: . Allergic reactions, such as hives or itching . Flu-like signs and  symptoms, including chills, fatigue, fever, and muscle aches and pains . Nausea, vomiting . Diarrhea . Skin rashes . Low blood pressure   The CDC is recommending patients who receive monoclonal antibody treatments wait at least 90 days before being vaccinated.  Currently, there are no data on the safety and efficacy of mRNA COVID-19 vaccines in persons who received monoclonal antibodies or convalescent plasma as part of COVID-19 treatment. Based on the estimated half-life of such therapies as well as evidence suggesting that reinfection is uncommon in the 90 days after initial infection, vaccination should be deferred for at least 90 days, as a precautionary measure until additional information becomes available, to avoid interference of the antibody treatment with vaccine-induced immune responses. If you have any questions or concerns after the infusion please call the Advanced Practice Provider on call at [phone]. This number is ONLY intended for your use regarding questions or concerns about the infusion post-treatment side-effects.  Please do not provide this number to others for use. For return to work notes please contact your primary care provider.   If someone you know is interested in receiving treatment please have them call the COVID hotline at [phone].   

## 2020-06-11 DIAGNOSIS — F1020 Alcohol dependence, uncomplicated: Secondary | ICD-10-CM | POA: Diagnosis not present

## 2020-07-23 DIAGNOSIS — F1020 Alcohol dependence, uncomplicated: Secondary | ICD-10-CM | POA: Diagnosis not present

## 2020-07-30 ENCOUNTER — Encounter: Payer: Self-pay | Admitting: Internal Medicine

## 2020-07-31 DIAGNOSIS — L812 Freckles: Secondary | ICD-10-CM | POA: Diagnosis not present

## 2020-07-31 DIAGNOSIS — L821 Other seborrheic keratosis: Secondary | ICD-10-CM | POA: Diagnosis not present

## 2020-07-31 DIAGNOSIS — L430 Hypertrophic lichen planus: Secondary | ICD-10-CM | POA: Diagnosis not present

## 2020-07-31 DIAGNOSIS — Z85828 Personal history of other malignant neoplasm of skin: Secondary | ICD-10-CM | POA: Diagnosis not present

## 2020-07-31 DIAGNOSIS — L820 Inflamed seborrheic keratosis: Secondary | ICD-10-CM | POA: Diagnosis not present

## 2020-07-31 DIAGNOSIS — L570 Actinic keratosis: Secondary | ICD-10-CM | POA: Diagnosis not present

## 2020-07-31 DIAGNOSIS — L308 Other specified dermatitis: Secondary | ICD-10-CM | POA: Diagnosis not present

## 2020-08-06 DIAGNOSIS — F1020 Alcohol dependence, uncomplicated: Secondary | ICD-10-CM | POA: Diagnosis not present

## 2020-08-13 ENCOUNTER — Ambulatory Visit (INDEPENDENT_AMBULATORY_CARE_PROVIDER_SITE_OTHER): Payer: BC Managed Care – PPO

## 2020-08-13 ENCOUNTER — Ambulatory Visit (INDEPENDENT_AMBULATORY_CARE_PROVIDER_SITE_OTHER): Payer: BC Managed Care – PPO | Admitting: Family Medicine

## 2020-08-13 ENCOUNTER — Encounter: Payer: Self-pay | Admitting: Family Medicine

## 2020-08-13 ENCOUNTER — Other Ambulatory Visit: Payer: Self-pay

## 2020-08-13 VITALS — BP 122/90 | HR 89 | Ht 67.0 in | Wt 176.0 lb

## 2020-08-13 DIAGNOSIS — M999 Biomechanical lesion, unspecified: Secondary | ICD-10-CM | POA: Diagnosis not present

## 2020-08-13 DIAGNOSIS — M25511 Pain in right shoulder: Secondary | ICD-10-CM

## 2020-08-13 DIAGNOSIS — M50321 Other cervical disc degeneration at C4-C5 level: Secondary | ICD-10-CM | POA: Diagnosis not present

## 2020-08-13 DIAGNOSIS — M50322 Other cervical disc degeneration at C5-C6 level: Secondary | ICD-10-CM | POA: Diagnosis not present

## 2020-08-13 DIAGNOSIS — M898X1 Other specified disorders of bone, shoulder: Secondary | ICD-10-CM | POA: Diagnosis not present

## 2020-08-13 DIAGNOSIS — M542 Cervicalgia: Secondary | ICD-10-CM

## 2020-08-13 DIAGNOSIS — G8929 Other chronic pain: Secondary | ICD-10-CM

## 2020-08-13 MED ORDER — TIZANIDINE HCL 2 MG PO TABS: 2.0000 mg | ORAL_TABLET | Freq: Every day | ORAL | 0 refills | Status: DC

## 2020-08-13 MED ORDER — PREDNISONE 20 MG PO TABS: 20.0000 mg | ORAL_TABLET | Freq: Every day | ORAL | 0 refills | Status: DC

## 2020-08-13 NOTE — Assessment & Plan Note (Signed)
   Decision today to treat with OMT was based on Physical Exam  After verbal consent patient was treated with  ME, FPR techniques in cervical, thoracic, rib, areas, all areas are chronic   Patient tolerated the procedure well with improvement in symptoms  Patient given exercises, stretches and lifestyle modifications  See medications in patient instructions if given  Patient will follow up in 4-8 weeks 

## 2020-08-13 NOTE — Assessment & Plan Note (Signed)
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 Patient has not responded as well to the anti-inflammatory but will try a very low dose of prednisone and given a very low dose of a muscle relaxer at night. Patient also responded extremely well to osteopathic manipulation. X-rays are pending. Patient should do relatively well but can call if anything seems to worsen again. Patient will follow up with me then again in 4 weeks otherwise.

## 2020-08-13 NOTE — Progress Notes (Signed)
[place] Phone: [phone] Subjective:   Fontaine No, am serving as a scribe for Dr. Hulan Saas. This visit occurred during the SARS-CoV-2 public health emergency.  Safety protocols were in place, including screening questions prior to the visit, additional usage of staff PPE, and extensive cleaning of exam room while observing appropriate contact time as indicated for disinfecting solutions.   I'm seeing this patient by the request  of:  Marton Redwood, MD  CC: Shoulder and upper neck pain  [id number]  Dustin Allen is a 60 y.o. male coming in with complaint of neck pain and right scapula pain for past 2 weeks. Insidious onset. Has been using 800mg  BID of IBU. Pain increases with reaching forward but reaching overhead improves his pain. Is able to sleep at night. 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 Patient states that he does not remember any true injury. Tries to stay active but nothing significant. Patient has not had any side effects to the ibuprofen but does not feel like it is working as well. Denies any shortness of breath, any recent travel history, any lower extremity swelling. Patient denies any radiation down the arm. States very localized to the medial border of the scapula.   Reviewed patient's chart including previous x-rays of the cervical spine in 2018 showing degenerative disc disease at C5-C6. Patient also in 2000 did have a fall where patient did have fractures of the right-sided ribs at the fifth, sixth and seventh.    Past Medical History:  Diagnosis Date  . Congenital absence of pectoral muscle    right   . Diabetes mellitus without complication ([place])    past dx 07-no meds-lost alot of weight   . Hypertension   . 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 EVALUATION UNDER ANESTHESIA WITH STRABISMUS  REPAIR     as a child   Social History   Socioeconomic History  . Marital status: Married    Spouse name: Not on file  . Number of children: Not on file  . Years of education: Not on file  . Highest education level: Not on file  Occupational History  . Not on file  Tobacco Use  . Smoking status: Never Smoker  . Smokeless tobacco: Current User    Types: Snuff  Substance and Sexual Activity  . Alcohol use: No    Alcohol/week: 0.0 standard drinks  . Drug use: No  . Sexual activity: Not on file  Other Topics Concern  . Not on file  Social History Narrative  . Not on file   Social Determinants of Health   Financial Resource Strain: Not on file  Food Insecurity: Not on file  Transportation Needs: Not on file  Physical Activity: Not on file  Stress: Not on file  Social Connections: Not on file   No Known Allergies Family History  Problem Relation Age of Onset  . Bipolar disorder Brother        deceased  . Prostate cancer Father   . Hypertension Mother   . Hyperlipidemia Mother   . Colon cancer Neg Hx     Current Outpatient Medications (Endocrine & Metabolic):  .  metFORMIN (GLUCOPHAGE) 1000 MG tablet, Take 1,000 mg by mouth 2 (two) times daily with a meal. .  predniSONE (DELTASONE) 20 MG tablet, Take 1 tablet (20 mg total)  by mouth daily with breakfast.  Current Outpatient Medications (Cardiovascular):  .  atorvastatin (LIPITOR) 20 MG tablet, Take 20 mg by mouth at bedtime. Marland Kitchen  lisinopril (PRINIVIL,ZESTRIL) 5 MG tablet, Take 5 mg by mouth daily.   Current Outpatient Medications (Analgesics):  .  aspirin 81 MG chewable tablet, Chew 81 mg by mouth daily.   Current Outpatient Medications (Other):  Marland Kitchen  Multiple Vitamins-Minerals (MULTIVITAMIN ADULT) TABS, Take 1 tablet by mouth daily.  Marland Kitchen  tiZANidine (ZANAFLEX) 2 MG tablet, Take 1 tablet (2 mg total) by mouth at bedtime.   Reviewed prior external information including notes and imaging from  primary care  provider As well as notes that were available from care everywhere and other healthcare systems.  Past medical history, social, surgical and family history all reviewed in electronic medical record.  No pertanent information unless stated regarding to the chief complaint.   Review of Systems:  No headache, visual changes, nausea, vomiting, diarrhea, constipation, dizziness, abdominal pain, skin rash, fevers, chills, night sweats, weight loss, swollen lymph nodes, body aches, joint swelling, chest pain, shortness of breath, mood changes. POSITIVE muscle aches only of the upper back  Objective  Blood pressure 122/90, pulse 89, height 5\' 7"  (1.702 m), weight 176 lb (79.8 kg), SpO2 97 %.   General: No apparent distress alert and oriented x3 mood and affect normal, dressed appropriately.  HEENT: Pupils equal, extraocular movements intact  Respiratory: Patient's speak in full sentences and does not appear short of breath  Cardiovascular: No lower extremity edema, non tender, no erythema  Gait normal with good balance and coordination.  MSK:  Non tender with full range of motion and good stability and symmetric strength and tone of shoulders, elbows, wrist, hip, knee and ankles bilaterally.  Patient's neck exam very mild loss of lordosis but does have full range of motion. Negative Spurling's. Patient does have tightness noted at the base of the neck on the right side. Patient does have trigger point noted in the rhomboid and trapezius on the right side in the medial aspect of the scapula. No pain over the bones in the cells. Full range of motion of the right shoulder noted. No significant impingement noted. 5 out of 5 strength of the rotator cuff.  Osteopathic findings C6 flexed rotated and side bent right T3 extended rotated and side bent right inhaled third rib    Impression and Recommendations:     The above documentation has been reviewed and is accurate and complete Lyndal Pulley,  DO

## 2020-08-13 NOTE — Patient Instructions (Addendum)
Xray today Exercises 3x a week Prednisone 20mg  for 5 days Do not use NSAIDS such as Advil or Aleve when taking Prednisone It is ok to use Tylenol for additional pain relief Zanaflex 2mg  at night as needed See me again in 4 weeks

## 2020-08-27 DIAGNOSIS — F1020 Alcohol dependence, uncomplicated: Secondary | ICD-10-CM | POA: Diagnosis not present

## 2020-09-03 DIAGNOSIS — J0100 Acute maxillary sinusitis, unspecified: Secondary | ICD-10-CM | POA: Diagnosis not present

## 2020-09-05 DIAGNOSIS — H18413 Arcus senilis, bilateral: Secondary | ICD-10-CM | POA: Diagnosis not present

## 2020-09-05 DIAGNOSIS — H2513 Age-related nuclear cataract, bilateral: Secondary | ICD-10-CM | POA: Diagnosis not present

## 2020-09-05 DIAGNOSIS — H53033 Strabismic amblyopia, bilateral: Secondary | ICD-10-CM | POA: Diagnosis not present

## 2020-09-05 DIAGNOSIS — H40013 Open angle with borderline findings, low risk, bilateral: Secondary | ICD-10-CM | POA: Diagnosis not present

## 2020-09-05 DIAGNOSIS — H2511 Age-related nuclear cataract, right eye: Secondary | ICD-10-CM | POA: Diagnosis not present

## 2020-09-12 DIAGNOSIS — H2511 Age-related nuclear cataract, right eye: Secondary | ICD-10-CM | POA: Diagnosis not present

## 2020-09-12 DIAGNOSIS — H2512 Age-related nuclear cataract, left eye: Secondary | ICD-10-CM | POA: Diagnosis not present

## 2020-09-12 HISTORY — PX: CATARACT EXTRACTION: SUR2

## 2020-09-15 NOTE — Progress Notes (Signed)
[place] Phone: [phone] Subjective:   Fontaine No, am serving as a scribe for Dr. Hulan Saas. This visit occurred during the SARS-CoV-2 public health emergency.  Safety protocols were in place, including screening questions prior to the visit, additional usage of staff PPE, and extensive cleaning of exam room while observing appropriate contact time as indicated for disinfecting solutions.   I'm seeing this patient by the request  of:  Ginger Organ., MD  CC: back and neck pain   [id number]  Dustin Allen is a 60 y.o. male coming in with complaint of back and neck pain. OMT 08/13/2020.Patient states that his pain is not as bad in the morning but worsens in the afternoon. Pain increased with driving. Pain came back after finishing prednisone. Adjustment lasted for one day.  Patient states that it is still very severe.  Affecting daily activities fairly regularly.  Medications patient has been prescribed: Prednisone, Zanaflex          Reviewed prior external information including notes and imaging from previsou exam, outside providers and external EMR if available.   As well as notes that were available from care everywhere and other healthcare systems.  Past medical history, social, surgical and family history all reviewed in electronic medical record.  No pertanent information unless stated regarding to the chief complaint.   Past Medical History:  Diagnosis Date  . Congenital absence of pectoral muscle    right   . Diabetes mellitus without complication ([place])    past dx 07-no meds-lost alot of weight   . Hypertension   . Substance abuse ([place])    remission since 10-2004    No Known Allergies   Review of Systems:  No headache, visual changes, nausea, vomiting, diarrhea, constipation, dizziness, abdominal pain, skin rash, fevers, chills, night sweats, weight loss, swollen lymph nodes,  body aches, joint swelling, chest pain, shortness of breath, mood changes. POSITIVE muscle aches  Objective  Blood pressure (!) 130/98, pulse 76, height 5\' 7"  (1.702 m), weight 176 lb (79.8 kg), SpO2 99 %.   General: No apparent distress alert and oriented x3 mood and affect normal, dressed appropriately.  HEENT: Pupils equal, extraocular movements intact  Respiratory: Patient's speak in full sentences and does not appear short of breath  Cardiovascular: No lower extremity edema, non tender, no erythema  Gait normal with good balance and coordination.  MSK:  Non tender with full range of motion and good stability and symmetric strength and tone of shoulders, elbows, wrist, hip, knee and ankles bilaterally.  Neck exam does have some loss of lordosis.  Patient does have a very mild positive Spurling's on the right side with radicular symptoms in the C5 distribution.  No significant weakness of the upper extremity but patient is very uncomfortable.   Osteopathic findings  C6 flexed rotated and side bent right T3 extended rotated and side bent right inhaled rib T8 extended rotated and side bent left       Assessment and Plan:  Degenerative disc disease, cervical Patient's x-rays that show the patient had significant degenerative disc disease.  Now having more radicular symptoms in the C5-C6 distribution.  At this point this is affecting all daily activities.  Attempted osteopathic manipulation but More of a muscle energy.  Patient is to increase activity slowly.  Follow-up with me again after imaging to discuss.    Nonallopathic problems  Decision today to treat with  OMT was based on Physical Exam  After verbal consent patient was treated with  ME, FPR techniques in cervical, rib, thoracic,areas avoided HVLA on the neck secondary to radicular symptoms  Patient tolerated the procedure well with improvement in symptoms  Patient given exercises, stretches and lifestyle  modifications  See medications in patient instructions if given  Patient will follow up in 4-8 weeks      The above documentation has been reviewed and is accurate and complete Dustin Pulley, DO       Note: This dictation was prepared with Dragon dictation along with smaller phrase technology. Any transcriptional errors that result from this process are unintentional.

## 2020-09-16 ENCOUNTER — Ambulatory Visit (INDEPENDENT_AMBULATORY_CARE_PROVIDER_SITE_OTHER): Payer: BC Managed Care – PPO | Admitting: Family Medicine

## 2020-09-16 ENCOUNTER — Other Ambulatory Visit: Payer: Self-pay

## 2020-09-16 ENCOUNTER — Encounter: Payer: Self-pay | Admitting: Family Medicine

## 2020-09-16 VITALS — BP 130/98 | HR 76 | Ht 67.0 in | Wt 176.0 lb

## 2020-09-16 DIAGNOSIS — M5030 Other cervical disc degeneration, unspecified cervical region: Secondary | ICD-10-CM | POA: Insufficient documentation

## 2020-09-16 DIAGNOSIS — M542 Cervicalgia: Secondary | ICD-10-CM

## 2020-09-16 DIAGNOSIS — M999 Biomechanical lesion, unspecified: Secondary | ICD-10-CM | POA: Diagnosis not present

## 2020-09-16 NOTE — Assessment & Plan Note (Signed)
Patient's x-rays that show the patient had significant degenerative disc disease.  Now having more radicular symptoms in the C5-C6 distribution.  At this point this is affecting all daily activities.  Attempted osteopathic manipulation but More of a muscle energy.  Patient is to increase activity slowly.  Follow-up with me again after imaging to discuss.

## 2020-09-16 NOTE — Patient Instructions (Signed)
If pain gets worse we can send in gabapentin MRI cervical spine  [phone] Have appt in 4-6 weeks

## 2020-09-22 ENCOUNTER — Other Ambulatory Visit: Payer: Self-pay

## 2020-09-22 ENCOUNTER — Ambulatory Visit
Admission: RE | Admit: 2020-09-22 | Discharge: 2020-09-22 | Disposition: A | Discharge status: Home / Self Care | Payer: BC Managed Care – PPO | Source: Ambulatory Visit | Attending: Family Medicine | Admitting: Family Medicine

## 2020-09-22 DIAGNOSIS — M4802 Spinal stenosis, cervical region: Secondary | ICD-10-CM | POA: Diagnosis not present

## 2020-09-22 DIAGNOSIS — M542 Cervicalgia: Secondary | ICD-10-CM

## 2020-09-23 ENCOUNTER — Other Ambulatory Visit: Payer: Self-pay

## 2020-09-23 ENCOUNTER — Encounter: Payer: Self-pay | Admitting: Family Medicine

## 2020-09-23 DIAGNOSIS — N888 Other specified noninflammatory disorders of cervix uteri: Secondary | ICD-10-CM

## 2020-09-23 DIAGNOSIS — M5412 Radiculopathy, cervical region: Secondary | ICD-10-CM

## 2020-09-25 ENCOUNTER — Other Ambulatory Visit: Payer: Self-pay

## 2020-09-25 ENCOUNTER — Ambulatory Visit (AMBULATORY_SURGERY_CENTER): Payer: BC Managed Care – PPO | Admitting: *Deleted

## 2020-09-25 VITALS — Ht 67.0 in | Wt 168.0 lb

## 2020-09-25 DIAGNOSIS — Z86010 Personal history of colonic polyps: Secondary | ICD-10-CM

## 2020-09-25 MED ORDER — SUPREP BOWEL PREP KIT 17.5-3.13-1.6 GM/177ML PO SOLN: 1.0000 | Freq: Once | ORAL | 0 refills | Status: AC

## 2020-09-25 NOTE — Progress Notes (Signed)
No egg or soy allergy known to patient  No issues with past sedation with any surgeries or procedures Patient denies ever being told they had issues or difficulty with intubation  No FH of Malignant Hyperthermia No diet pills per patient No home 02 use per patient  No blood thinners per patient  Pt denies issues with constipation  No A fib or A flutter  EMMI video to pt or via [place] 19 guidelines implemented in [place] today with Pt and RN  Pt is fully vaccinated  for Covid    Due to the COVID-19 pandemic we are asking patients to follow certain guidelines.  Pt aware of COVID protocols and [facility] guidelines   Pt verified name, DOB, address and insurance during PV today. Pt mailed instruction packet to included paper to complete and mail back to [facility] with addressed and stamped envelope, Emmi video, copy of consent form to read and not return, and instructionS.. PV completed over the phone. Pt encouraged to call with questions or issues. My Chart instructions to pt as well

## 2020-09-30 DIAGNOSIS — Z1339 Encounter for screening examination for other mental health and behavioral disorders: Secondary | ICD-10-CM | POA: Diagnosis not present

## 2020-09-30 DIAGNOSIS — Z0000 Encounter for general adult medical examination without abnormal findings: Secondary | ICD-10-CM | POA: Diagnosis not present

## 2020-09-30 DIAGNOSIS — E1129 Type 2 diabetes mellitus with other diabetic kidney complication: Secondary | ICD-10-CM | POA: Diagnosis not present

## 2020-09-30 DIAGNOSIS — Z125 Encounter for screening for malignant neoplasm of prostate: Secondary | ICD-10-CM | POA: Diagnosis not present

## 2020-09-30 DIAGNOSIS — I10 Essential (primary) hypertension: Secondary | ICD-10-CM | POA: Diagnosis not present

## 2020-09-30 DIAGNOSIS — E785 Hyperlipidemia, unspecified: Secondary | ICD-10-CM | POA: Diagnosis not present

## 2020-09-30 DIAGNOSIS — Z1331 Encounter for screening for depression: Secondary | ICD-10-CM | POA: Diagnosis not present

## 2020-09-30 DIAGNOSIS — R82998 Other abnormal findings in urine: Secondary | ICD-10-CM | POA: Diagnosis not present

## 2020-10-07 ENCOUNTER — Encounter: Payer: Self-pay | Admitting: Internal Medicine

## 2020-10-08 ENCOUNTER — Encounter: Payer: Self-pay | Admitting: Family Medicine

## 2020-10-08 ENCOUNTER — Ambulatory Visit
Admission: RE | Admit: 2020-10-08 | Discharge: 2020-10-08 | Disposition: A | Discharge status: Home / Self Care | Payer: BC Managed Care – PPO | Source: Ambulatory Visit | Attending: Family Medicine | Admitting: Family Medicine

## 2020-10-08 DIAGNOSIS — M5020 Other cervical disc displacement, unspecified cervical region: Secondary | ICD-10-CM | POA: Diagnosis not present

## 2020-10-08 DIAGNOSIS — F1020 Alcohol dependence, uncomplicated: Secondary | ICD-10-CM | POA: Diagnosis not present

## 2020-10-08 DIAGNOSIS — M47812 Spondylosis without myelopathy or radiculopathy, cervical region: Secondary | ICD-10-CM | POA: Diagnosis not present

## 2020-10-08 DIAGNOSIS — N888 Other specified noninflammatory disorders of cervix uteri: Secondary | ICD-10-CM

## 2020-10-08 MED ORDER — IOPAMIDOL (ISOVUE-300) INJECTION 61%
75.0000 mL | Freq: Once | INTRAVENOUS | Status: AC | PRN
  Administered 2020-10-08: 75 mL via INTRAVENOUS

## 2020-10-09 ENCOUNTER — Other Ambulatory Visit: Payer: Self-pay

## 2020-10-09 ENCOUNTER — Ambulatory Visit (AMBULATORY_SURGERY_CENTER): Payer: BC Managed Care – PPO | Admitting: Internal Medicine

## 2020-10-09 ENCOUNTER — Encounter: Payer: Self-pay | Admitting: Internal Medicine

## 2020-10-09 VITALS — BP 129/83 | HR 62 | Temp 97.1°F | Resp 13 | Ht 67.0 in | Wt 168.0 lb

## 2020-10-09 DIAGNOSIS — Z86010 Personal history of colonic polyps: Secondary | ICD-10-CM | POA: Diagnosis not present

## 2020-10-09 DIAGNOSIS — D122 Benign neoplasm of ascending colon: Secondary | ICD-10-CM | POA: Diagnosis not present

## 2020-10-09 DIAGNOSIS — Q313 Laryngocele: Secondary | ICD-10-CM

## 2020-10-09 DIAGNOSIS — Z1211 Encounter for screening for malignant neoplasm of colon: Secondary | ICD-10-CM | POA: Diagnosis not present

## 2020-10-09 DIAGNOSIS — D125 Benign neoplasm of sigmoid colon: Secondary | ICD-10-CM | POA: Diagnosis not present

## 2020-10-09 MED ORDER — SODIUM CHLORIDE 0.9 % IV SOLN: 500.0000 mL | Freq: Once | INTRAVENOUS | Status: DC

## 2020-10-09 NOTE — Patient Instructions (Signed)
Handouts given:  Diverticulosis, Hemorrhoids, Hemorrhoidal banding, Polyps Resume previous diet Continue current medications Await pathology results  YOU HAD AN ENDOSCOPIC PROCEDURE TODAY AT [facility]:   Refer to the procedure report that was given to you for any specific questions about what was found during the examination.  If the procedure report does not answer your questions, please call your gastroenterologist to clarify.  If you requested that your care partner not be given the details of your procedure findings, then the procedure report has been included in a sealed envelope for you to review at your convenience later.  YOU SHOULD EXPECT: Some feelings of bloating in the abdomen. Passage of more gas than usual.  Walking can help get rid of the air that was put into your GI tract during the procedure and reduce the bloating. If you had a lower endoscopy (such as a colonoscopy or flexible sigmoidoscopy) you may notice spotting of blood in your stool or on the toilet paper. If you underwent a bowel prep for your procedure, you may not have a normal bowel movement for a few days.  Please Note:  You might notice some irritation and congestion in your nose or some drainage.  This is from the oxygen used during your procedure.  There is no need for concern and it should clear up in a day or so.  SYMPTOMS TO REPORT IMMEDIATELY:   Following lower endoscopy (colonoscopy or flexible sigmoidoscopy):  Excessive amounts of blood in the stool  Significant tenderness or worsening of abdominal pains  Swelling of the abdomen that is new, acute  Fever of 100F or higher  For urgent or emergent issues, a gastroenterologist can be reached at any hour by calling [phone]. Do not use MyChart messaging for urgent concerns.    DIET:  We do recommend a small meal at first, but then you may proceed to your regular diet.  Drink plenty of fluids but you should avoid alcoholic beverages  for 24 hours.  ACTIVITY:  You should plan to take it easy for the rest of today and you should NOT DRIVE or use heavy machinery until tomorrow (because of the sedation medicines used during the test).    FOLLOW UP: Our staff will call the number listed on your records 48-72 hours following your procedure to check on you and address any questions or concerns that you may have regarding the information given to you following your procedure. If we do not reach you, we will leave a message.  We will attempt to reach you two times.  During this call, we will ask if you have developed any symptoms of COVID 19. If you develop any symptoms (ie: fever, flu-like symptoms, shortness of breath, cough etc.) before then, please call [phone].  If you test positive for Covid 19 in the 2 weeks post procedure, please call and report this information to [place].    If any biopsies were taken you will be contacted by phone or by letter within the next 1-3 weeks.  Please call us at [phone] if you have not heard about the biopsies in 3 weeks.   SIGNATURES/CONFIDENTIALITY: You and/or your care partner have signed paperwork which will be entered into your electronic medical record.  These signatures attest to the fact that that the information above on your After Visit Summary has been reviewed and is understood.  Full responsibility of the confidentiality of this discharge information lies with you and/or your care-partner.

## 2020-10-09 NOTE — Progress Notes (Signed)
Pt Drowsy. VSS. To PACU, report to RN. No anesthetic complications noted.  

## 2020-10-09 NOTE — Op Note (Signed)
[facility] Patient Name: Dustin Allen Procedure Date: 10/09/2020 7:06 AM MRN: [id number] Endoscopist: Jerene Bears , MD Age: 60 Referring MD:  Date of Birth: 1961-06-08 Gender: Male Account #: [id number] Procedure:                Colonoscopy Indications:              High risk colon cancer surveillance: Personal                            history of non-advanced adenoma, Last colonoscopy:                            August 2016 Medicines:                Monitored Anesthesia Care Procedure:                Pre-Anesthesia Assessment:                           - Prior to the procedure, a History and Physical                            was performed, and patient medications and                            allergies were reviewed. The patient's tolerance of                            previous anesthesia was also reviewed. The risks                            and benefits of the procedure and the sedation                            options and risks were discussed with the patient.                            All questions were answered, and informed consent                            was obtained. Prior Anticoagulants: The patient has                            taken no previous anticoagulant or antiplatelet                            agents. ASA Grade Assessment: II - A patient with                            mild systemic disease. After reviewing the risks                            and benefits, the patient was deemed in  satisfactory condition to undergo the procedure.                           After obtaining informed consent, the colonoscope                            was passed under direct vision. Throughout the                            procedure, the patient's blood pressure, pulse, and                            oxygen saturations were monitored continuously. The                            Olympus CF-HQ190L [id number]) Colonoscope was                             introduced through the anus and advanced to the                            cecum, identified by appendiceal orifice and                            ileocecal valve. The colonoscopy was performed                            without difficulty. The patient tolerated the                            procedure well. The quality of the bowel                            preparation was good. The ileocecal valve,                            appendiceal orifice, and rectum were photographed. Scope In: 8:10:39 AM Scope Out: 8:27:49 AM Scope Withdrawal Time: 0 hours 13 minutes 46 seconds  Total Procedure Duration: 0 hours 17 minutes 10 seconds  Findings:                 The digital rectal exam was normal.                           A 2 mm polyp was found in the distal ascending                            colon. The polyp was sessile. The polyp was removed                            with a cold snare. Resection and retrieval were                            complete.  A 4 mm polyp was found in the sigmoid colon. The                            polyp was sessile. The polyp was removed with a                            cold snare. Resection and retrieval were complete.                           Multiple small and large-mouthed diverticula were                            found in the sigmoid colon, descending colon and                            transverse colon.                           Internal hemorrhoids were found during                            retroflexion. The hemorrhoids were small. Complications:            No immediate complications. Estimated Blood Loss:     Estimated blood loss was minimal. Impression:               - One 2 mm polyp in the distal ascending colon,                            removed with a cold snare. Resected and retrieved.                           - One 4 mm polyp in the sigmoid colon, removed with                            a cold  snare. Resected and retrieved.                           - Diverticulosis in the sigmoid colon, in the                            descending colon and in the transverse colon.                           - Small internal hemorrhoids. Recommendation:           - Patient has a contact number available for                            emergencies. The signs and symptoms of potential                            delayed complications were discussed with the  patient. Return to normal activities tomorrow.                            Written discharge instructions were provided to the                            patient.                           - Resume previous diet.                           - Continue present medications.                           - Await pathology results.                           - Repeat colonoscopy is recommended for                            surveillance. The colonoscopy date will be                            determined after pathology results from today's                            exam become available for review. Jerene Bears, MD 10/09/2020 8:34:07 AM This report has been signed electronically.

## 2020-10-09 NOTE — Progress Notes (Signed)
Medical history reviewed with no changes since PV. VS assessed by C.W 

## 2020-10-10 ENCOUNTER — Ambulatory Visit
Admission: RE | Admit: 2020-10-10 | Discharge: 2020-10-10 | Disposition: A | Discharge status: Home / Self Care | Payer: BC Managed Care – PPO | Source: Ambulatory Visit | Attending: Family Medicine | Admitting: Family Medicine

## 2020-10-10 ENCOUNTER — Other Ambulatory Visit: Payer: Self-pay

## 2020-10-10 DIAGNOSIS — M5010 Cervical disc disorder with radiculopathy, unspecified cervical region: Secondary | ICD-10-CM | POA: Diagnosis not present

## 2020-10-10 DIAGNOSIS — M5412 Radiculopathy, cervical region: Secondary | ICD-10-CM

## 2020-10-10 MED ORDER — IOPAMIDOL (ISOVUE-M 300) INJECTION 61%
1.0000 mL | Freq: Once | INTRAMUSCULAR | Status: AC | PRN
  Administered 2020-10-10: 1 mL via EPIDURAL

## 2020-10-10 MED ORDER — TRIAMCINOLONE ACETONIDE 40 MG/ML IJ SUSP (RADIOLOGY)
60.0000 mg | Freq: Once | INTRAMUSCULAR | Status: AC
  Administered 2020-10-10: 60 mg via EPIDURAL

## 2020-10-10 NOTE — Discharge Instructions (Signed)

## 2020-10-13 ENCOUNTER — Telehealth: Payer: Self-pay

## 2020-10-13 NOTE — Telephone Encounter (Signed)
  Follow up Call-  Call back number 10/09/2020  Post procedure Call Back phone  # [phone]  Permission to leave phone message Yes  Some recent data might be hidden     Patient questions:  Do you have a fever, pain , or abdominal swelling? No. Pain Score  0 *  Have you tolerated food without any problems? Yes.    Have you been able to return to your normal activities? Yes.    Do you have any questions about your discharge instructions: Diet   No. Medications  No. Follow up visit  No.  Do you have questions or concerns about your Care? No.  Actions: * If pain score is 4 or above: No action needed, pain <4.  1. Have you developed a fever since your procedure? no  2.   Have you had an respiratory symptoms (SOB or cough) since your procedure? no  3.   Have you tested positive for COVID 19 since your procedure no  4.   Have you had any family members/close contacts diagnosed with the COVID 19 since your procedure?  no   If yes to any of these questions please route to Joylene John, RN and Joella Prince, RN

## 2020-10-15 NOTE — Progress Notes (Signed)
  [facility] [address] [place] Phone: [phone] Subjective:   I Dustin Allen am serving as a Education administrator for Dr. Hulan Allen.  This visit occurred during the SARS-CoV-2 public health emergency.  Safety protocols were in place, including screening questions prior to the visit, additional usage of staff PPE, and extensive cleaning of exam room while observing appropriate contact time as indicated for disinfecting solutions.   I'm seeing this patient by the request  of:  Dustin Allen., MD  CC: neck pain follow up   GGY:[id number]  Dustin Allen is a 60 y.o. male coming in with complaint of back and neck pain. OMT 09/16/2020. Patient states after the block he feels no pain. States the pain is taken care of but the problem is not fixed. Wants to know if PT will help.   Medications patient has been prescribed: None          Reviewed prior external information including notes and imaging from previsou exam, outside providers and external EMR if available. This includes recent gastro notes for laryngeocele seeing ENT on June 15th  Recent colonoscopy also reviewed from Dr. Hilarie Allen   As well as notes that were available from care everywhere and other healthcare systems.  Past medical history, social, surgical and family history all reviewed in electronic medical record.  No pertanent information unless stated regarding to the chief complaint.   Past Medical History:  Diagnosis Date  . Arthritis    NECK- NOT DX'D  . Congenital absence of pectoral muscle    right   . Diabetes mellitus without complication ([place])    past dx 07-no meds-lost alot of weight   . Hypertension   . Substance abuse ([place])    remission since 10-2004    No Known Allergies   Review of Systems:  No headache, visual changes, nausea, vomiting, diarrhea, constipation, dizziness, abdominal pain, skin rash, fevers, chills, night sweats, weight loss, swollen lymph  nodes, body aches, joint swelling, chest pain, shortness of breath, mood changes. POSITIVE muscle aches  Objective  Blood pressure 112/80, pulse 62, height 5\' 7"  (1.702 m), weight 164 lb (74.4 kg), SpO2 99 %.   General: No apparent distress alert and oriented x3 mood and affect normal, dressed appropriately.  HEENT: Pupils equal, extraocular movements intact  Respiratory: Patient's speak in full sentences and does not appear short of breath  Cardiovascular: No lower extremity edema, non tender, no erythema  Neck exam shows the patient does have some improvement in range of motion.  Negative Spurling's today.  Patient still does have some limited in the extension of the neck by 10 degrees.  Mild limited sidebending to the right.  5 out of 5 strength of the upper extremities.  Overall improvement noted       Assessment and Plan:      The above documentation has been reviewed and is accurate and complete Dustin Pulley, DO       Note: This dictation was prepared with Dragon dictation along with smaller phrase technology. Any transcriptional errors that result from this process are unintentional.

## 2020-10-16 ENCOUNTER — Ambulatory Visit (INDEPENDENT_AMBULATORY_CARE_PROVIDER_SITE_OTHER): Payer: BC Managed Care – PPO | Admitting: Family Medicine

## 2020-10-16 ENCOUNTER — Encounter: Payer: Self-pay | Admitting: Family Medicine

## 2020-10-16 ENCOUNTER — Other Ambulatory Visit: Payer: Self-pay

## 2020-10-16 VITALS — BP 112/80 | HR 62 | Ht 67.0 in | Wt 164.0 lb

## 2020-10-16 DIAGNOSIS — M5030 Other cervical disc degeneration, unspecified cervical region: Secondary | ICD-10-CM

## 2020-10-16 DIAGNOSIS — M542 Cervicalgia: Secondary | ICD-10-CM

## 2020-10-16 NOTE — Patient Instructions (Addendum)
Good to see you Glad you are doing better PT will call you Keep appointment with ENT See me again in 2 months to see if you are doing better. If pain returns write me we can do epidural if needed

## 2020-10-16 NOTE — Assessment & Plan Note (Signed)
Patient has responded extremely well to the epidural.  Patient does have known arthritic changes with a nerve impingement.  Encourage patient to continue the home exercises but will start with formal physical therapy.  Patient at this point though is not taking any other medications that is prescribed by me.  Discussed we can repeat the epidurals if necessary.  We discussed different things to come back for such as radiation down the arm, significant worsening pain or any acute headaches.  Patient will follow-up with me again in 2 months

## 2020-10-17 ENCOUNTER — Encounter: Payer: Self-pay | Admitting: Internal Medicine

## 2020-10-17 DIAGNOSIS — H2512 Age-related nuclear cataract, left eye: Secondary | ICD-10-CM | POA: Diagnosis not present

## 2020-10-17 DIAGNOSIS — H25011 Cortical age-related cataract, right eye: Secondary | ICD-10-CM | POA: Diagnosis not present

## 2020-11-05 DIAGNOSIS — F1020 Alcohol dependence, uncomplicated: Secondary | ICD-10-CM | POA: Diagnosis not present

## 2020-11-26 ENCOUNTER — Ambulatory Visit (INDEPENDENT_AMBULATORY_CARE_PROVIDER_SITE_OTHER): Payer: BC Managed Care – PPO | Admitting: Otolaryngology

## 2020-11-26 ENCOUNTER — Other Ambulatory Visit: Payer: Self-pay

## 2020-11-26 DIAGNOSIS — R0683 Snoring: Secondary | ICD-10-CM

## 2020-11-26 DIAGNOSIS — R930 Abnormal findings on diagnostic imaging of skull and head, not elsewhere classified: Secondary | ICD-10-CM

## 2020-11-26 NOTE — Progress Notes (Signed)
HPI: Dustin Allen is a 60 y.o. male who presents is referred by Hulan Saas, DO for evaluation of abnormality noted on recent MRI scan for evaluation of his neck.  Apparently had a T2 hyperintense lesion in the right piriform sinus that measured 2 x 1 cm in size and represented either secretions versus a cyst as reported by the radiologist direct visualization was recommended.  Patient is referred here. Patient does not smoke. Denies any sore throat or trouble swallowing. He does complain of snoring per his wife..  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 DX'D   Congenital absence of pectoral muscle    right    Diabetes mellitus without complication ([place])    past dx 07-no meds-lost alot of weight    Hypertension    Substance abuse ([place])    remission since 10-2004   Past Surgical History:  Procedure Laterality Date   CATARACT EXTRACTION Right 09/12/2020   COLONOSCOPY     EVALUATION UNDER ANESTHESIA WITH STRABISMUS REPAIR     as a child   EYE SURGERY     AGE 39    POLYPECTOMY     Social History   Socioeconomic History   Marital status: Married    Spouse name: Not on file   Number of children: Not on file   Years of education: Not on file   Highest education level: Not on file  Occupational History   Not on file  Tobacco Use   Smoking status: Never   Smokeless tobacco: Former    Types: Snuff  Substance and Sexual Activity   Alcohol use: No    Alcohol/week: 0.0 standard drinks   Drug use: No   Sexual activity: Not on file  Other Topics Concern   Not on file  Social History Narrative   Not on file   Social Determinants of Health   Financial Resource Strain: Not on file  Food Insecurity: Not on file  Transportation Needs: Not on file  Physical Activity: Not on file  Stress: Not on file  Social Connections: Not on file   Family History  Problem Relation Age of Onset   Bipolar disorder Brother        deceased   Prostate cancer Father     Hypertension Mother    Hyperlipidemia Mother    Colon cancer Neg Hx    Colon polyps Neg Hx    Esophageal cancer Neg Hx    Rectal cancer Neg Hx    Stomach cancer Neg Hx    No Known Allergies Prior to Admission medications   Medication Sig Start Date End Date Taking? Authorizing Provider  aspirin 81 MG chewable tablet Chew 81 mg by mouth daily.    [provider]  atorvastatin (LIPITOR) 20 MG tablet Take 20 mg by mouth at bedtime.    [provider]  escitalopram (LEXAPRO) 10 MG tablet Take 1 tablet by mouth daily. 08/02/20   [provider]  irbesartan (AVAPRO) 75 MG tablet Take 75 mg by mouth daily. 07/27/20   [provider]  metFORMIN (GLUCOPHAGE-XR) 500 MG 24 hr tablet Take 1,000 mg by mouth 2 (two) times daily. 09/22/20   [provider]  Multiple Vitamins-Minerals (MULTIVITAMIN ADULT) TABS Take 1 tablet by mouth daily.    [provider]  predniSONE (DELTASONE) 20 MG tablet Take 1 tablet (20 mg total) by mouth daily with breakfast. 08/13/20   Lyndal Pulley, DO  Positive ROS: Otherwise negative  All other systems have been reviewed and were otherwise negative with the exception of those mentioned in the HPI and as above.  Physical Exam: Constitutional: Alert, well-appearing, no acute distress Ears: External ears without lesions or tenderness. Ear canals are clear bilaterally with intact, clear TMs.  Nasal: External nose without lesions. Septum is deviated to the right.. Clear nasal passages otherwise.  Mild rhinitis. Oral: Lips and gums without lesions. Tongue and palate mucosa without lesions. Posterior oropharynx clear.  Tonsil regions are benign in appearance.  Indirect laryngoscopy was difficult because of strong gag reflex. Fiberoptic laryngoscopy was performed through the left nostril.  The nasopharynx was clear.  The base of tongue vallecula epiglottis were normal.  Vocal cords were clear with normal vocal mobility  bilaterally.  The right piriform sinus was clear with no lesions noted and normal mucosa.  The fiberoptic laryngoscope was passed through the right piriform sinus down through the upper esophageal sphincter without difficulty the upper cervical esophagus was clear. Neck: No palpable adenopathy or masses.  No palpable masses around the larynx. Respiratory: Breathing comfortably  Skin: No facial/neck lesions or rash noted.  Laryngoscopy  Date/Time: 11/26/2020 9:08 AM Performed by: Rozetta Nunnery, MD Authorized by: Rozetta Nunnery, MD   Consent:    Consent obtained:  Verbal   Consent given by:  Patient Procedure details:    Indications: direct visualization of the upper aerodigestive tract and difficult indirect mirror examination     Medication:  Afrin   Scope location: left nare   Sinus:    Left nasopharynx: normal   Mouth:    Oropharynx: normal     Vallecula: normal     Base of tongue: normal     Epiglottis: normal   Throat:    Pyriform sinus: normal     True vocal cords: normal   Comments:     On fiberoptic laryngoscopy the piriform sinuses were clear bilaterally.  Both cords were normal with normal vocal mobility.  Assessment: Abnormality noted on the MRI scan of the right piriform sinus most likely represented secretions as he has clear right piriform sinus on fiberoptic laryngoscopy. Snoring  Plan: Reassured patient of normal piriform sinus on direct visualization with fiberoptic laryngoscope. Concerning his snoring recommended use of Flonase 2 sprays each nostril at night as this will help improve his nasal airway as well as sleeping on his side.  If his snoring persist or his wife notices obstruction of his breathing he may want sleep test performed.  This can be performed with his medical physician.   Radene Journey, MD   CC:

## 2020-12-16 NOTE — Progress Notes (Deleted)
Dustin Allen Phone: [phone] Subjective:    I'm seeing this patient by the request  of:  Ginger Organ., MD  CC: Neck pain follow-up  [id number]  10/16/2020 Patient has responded extremely well to the epidural.  Patient does have known arthritic changes with a nerve impingement.  Encourage patient to continue the home exercises but will start with formal physical therapy.  Patient at this point though is not taking any other medications that is prescribed by me.  Discussed we can repeat the epidurals if necessary.  We discussed different things to come back for such as radiation down the arm, significant worsening pain or any acute headaches.  Patient will follow-up with me again in 2 months  Update 12/17/2020 Dustin Allen is a 60 y.o. male coming in with complaint of DDD, Cervical.  Patient previously did have an epidural.  Patient was also to start with formal physical therapy.  Patient was to continue home exercises.   Patient did have a mild abnormality noted onMRI that was concerning for laryngeal seal.  Patient did see ENT.  They stated that this was likely more secondary to accumulation of secretions.  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'D   Congenital absence of pectoral muscle    right    Diabetes mellitus without complication ([place])    past dx 07-no meds-lost alot of weight    Hypertension    Substance abuse ([place])    remission since 10-2004   Past Surgical History:  Procedure Laterality Date   CATARACT EXTRACTION Right 09/12/2020   COLONOSCOPY     EVALUATION UNDER ANESTHESIA WITH STRABISMUS REPAIR     as a child   EYE SURGERY     AGE 49    POLYPECTOMY     Social History   Socioeconomic History   Marital status: Married    Spouse name: Not on file   Number of children: Not on file   Years of education: Not on file   Highest  education level: Not on file  Occupational History   Not on file  Tobacco Use   Smoking status: Never   Smokeless tobacco: Former    Types: Snuff  Substance and Sexual Activity   Alcohol use: No    Alcohol/week: 0.0 standard drinks   Drug use: No   Sexual activity: Not on file  Other Topics Concern   Not on file  Social History Narrative   Not on file   Social Determinants of Health   Financial Resource Strain: Not on file  Food Insecurity: Not on file  Transportation Needs: Not on file  Physical Activity: Not on file  Stress: Not on file  Social Connections: Not on file   No Known Allergies Family History  Problem Relation Age of Onset   Bipolar disorder Brother        deceased   Prostate cancer Father    Hypertension Mother    Hyperlipidemia Mother    Colon cancer Neg Hx    Colon polyps Neg Hx    Esophageal cancer Neg Hx    Rectal cancer Neg Hx    Stomach cancer Neg Hx     Current Outpatient Medications (Endocrine & Metabolic):    metFORMIN (GLUCOPHAGE-XR) 500 MG 24 hr tablet, Take 1,000 mg by mouth 2 (two) times daily.   predniSONE (DELTASONE) 20 MG  tablet, Take 1 tablet (20 mg total) by mouth daily with breakfast.  Current Outpatient Medications (Cardiovascular):    atorvastatin (LIPITOR) 20 MG tablet, Take 20 mg by mouth at bedtime.   irbesartan (AVAPRO) 75 MG tablet, Take 75 mg by mouth daily.   Current Outpatient Medications (Analgesics):    aspirin 81 MG chewable tablet, Chew 81 mg by mouth daily.   Current Outpatient Medications (Other):    escitalopram (LEXAPRO) 10 MG tablet, Take 1 tablet by mouth daily.   Multiple Vitamins-Minerals (MULTIVITAMIN ADULT) TABS, Take 1 tablet by mouth daily.   Reviewed prior external information including notes and imaging from  primary care provider As well as notes that were available from care everywhere and other healthcare systems.  Past medical history, social, surgical and family history all reviewed in  electronic medical record.  No pertanent information unless stated regarding to the chief complaint.   Review of Systems:  No headache, visual changes, nausea, vomiting, diarrhea, constipation, dizziness, abdominal pain, skin rash, fevers, chills, night sweats, weight loss, swollen lymph nodes, body aches, joint swelling, chest pain, shortness of breath, mood changes. POSITIVE muscle aches  Objective  There were no vitals taken for this visit.   General: No apparent distress alert and oriented x3 mood and affect normal, dressed appropriately.  HEENT: Pupils equal, extraocular movements intact  Respiratory: Patient's speak in full sentences and does not appear short of breath  Cardiovascular: No lower extremity edema, non tender, no erythema  Gait normal with good balance and coordination.  MSK:  Non tender with full range of motion and good stability and symmetric strength and tone of shoulders, elbows, wrist, hip, knee and ankles bilaterally.     Impression and Recommendations:    The above documentation has been reviewed and is accurate and complete Lyndal Pulley, DO

## 2020-12-17 ENCOUNTER — Ambulatory Visit: Payer: BC Managed Care – PPO | Admitting: Family Medicine

## 2021-01-22 DIAGNOSIS — L821 Other seborrheic keratosis: Secondary | ICD-10-CM | POA: Diagnosis not present

## 2021-01-22 DIAGNOSIS — D1801 Hemangioma of skin and subcutaneous tissue: Secondary | ICD-10-CM | POA: Diagnosis not present

## 2021-01-22 DIAGNOSIS — L812 Freckles: Secondary | ICD-10-CM | POA: Diagnosis not present

## 2021-01-22 DIAGNOSIS — Z85828 Personal history of other malignant neoplasm of skin: Secondary | ICD-10-CM | POA: Diagnosis not present

## 2021-04-01 DIAGNOSIS — N1831 Chronic kidney disease, stage 3a: Secondary | ICD-10-CM | POA: Diagnosis not present

## 2021-04-01 DIAGNOSIS — E1129 Type 2 diabetes mellitus with other diabetic kidney complication: Secondary | ICD-10-CM | POA: Diagnosis not present

## 2021-04-01 DIAGNOSIS — I129 Hypertensive chronic kidney disease with stage 1 through stage 4 chronic kidney disease, or unspecified chronic kidney disease: Secondary | ICD-10-CM | POA: Diagnosis not present

## 2021-05-13 DIAGNOSIS — H903 Sensorineural hearing loss, bilateral: Secondary | ICD-10-CM | POA: Diagnosis not present

## 2021-05-25 DIAGNOSIS — K429 Umbilical hernia without obstruction or gangrene: Secondary | ICD-10-CM | POA: Diagnosis not present

## 2021-06-23 ENCOUNTER — Ambulatory Visit: Admit: 2021-06-23 | Payer: BC Managed Care – PPO | Admitting: General Surgery

## 2021-06-23 SURGERY — REPAIR, HERNIA, VENTRAL, LAPAROSCOPIC
Anesthesia: General

## 2021-07-27 ENCOUNTER — Encounter: Payer: Self-pay | Admitting: Dietician

## 2021-07-27 ENCOUNTER — Encounter: Payer: BC Managed Care – PPO | Attending: Internal Medicine | Admitting: Dietician

## 2021-07-27 ENCOUNTER — Other Ambulatory Visit: Payer: Self-pay

## 2021-07-27 DIAGNOSIS — E119 Type 2 diabetes mellitus without complications: Secondary | ICD-10-CM | POA: Insufficient documentation

## 2021-07-27 DIAGNOSIS — E118 Type 2 diabetes mellitus with unspecified complications: Secondary | ICD-10-CM

## 2021-07-27 DIAGNOSIS — Z713 Dietary counseling and surveillance: Secondary | ICD-10-CM | POA: Diagnosis not present

## 2021-07-27 DIAGNOSIS — N1831 Chronic kidney disease, stage 3a: Secondary | ICD-10-CM

## 2021-07-27 NOTE — Patient Instructions (Addendum)
I messaged your doctor for a prescription.  If your insurance does not cover this, you can  FreeStyle Libre  2 or 3 OR Dexcom G6 CGM    I will message prescription for Strips for the Controur Next EZ and lancets for this as well.  Aim to be active 30 minutes most days.  Consider hiring the personal trainer again.  Exercise may give you more energy and will lower your blood sugar.  Mindfulness  Choice Portion size  Low sodium, low protein (60 grams daily) Avoid foods with Phos... added on the ingredient list.  Aim for 3-4 Carb Choices per meal (45-60 grams) +/- 1 either way  Aim for 0-1 Carbs per snack if hungry  Include protein in moderation with your meals and snacks Consider reading food labels for Total Carbohydrate of foods Consider  increasing your activity level by walking or other for 30 minutes daily as tolerated Consider checking BG at alternate times per day  Consider taking medication as directed by MD

## 2021-07-27 NOTE — Progress Notes (Signed)
Diabetes Self-Management Education  Visit Type: First/Initial  Appt. Start Time: 1015 Appt. End Time: 5732  07/27/2021  Mr. Dustin Allen, identified by name and date of birth, is a 61 y.o. male with a diagnosis of Diabetes: Type 2.   ASSESSMENT Patient is here today with his wife. He states that he knows how to eat and that he needs to exercise.  He states that he needs motivation to change   History includes Type 2 Diabetes (2017) with CKD stage 3a, HTN, alcoholism (sober since 2019) Medications include:  Metformin, Jardiance, atorvastatin Labs:  03/2021 8.1%, 09/30/2020 - A1C 7.7, proteinuria 5ug/mL, Cholesterol 113, Triglycerides 109, HDL 32, LDL 59, BUN 21, Creatinine 1.5, Potassium 4.5, eGFR 47.7  He has not been checking his blood sugar.  He brought an old meter which is not working and strips are expired.  New meter provided: Countrou Next EZ Lot [id number] Glucose was 132 fasting He is interested in a CGM and request sent to MD.  Weight hx: 162 lbs 07/27/2021 147 lbs 2017 (journaling, "not eating diet 170 lbs 2017  Goal 150 lbs  Patient lives with his wife.  She does the shopping and cooking.  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  Eats out frequently.  Skips meals frequently.  Wife cooks with fresh ingredients and low fat.  She does not buy sweets often as [place] has a hard time controlling the portion and frequency of use.  Height $Remov'5\' 7"'vIqsBH$  (1.702 m), weight 162 lb (73.5 kg). Body mass index is 25.37 kg/m.   Diabetes Self-Management Education - 07/27/21 1044       Visit Information   Visit Type First/Initial      Initial Visit   Diabetes Type Type 2    Are you currently following a meal plan? No    Are you taking your medications as prescribed? Yes    Date Diagnosed 2017      Health Coping   How would you rate your overall health? Good      Psychosocial Assessment   Patient Belief/Attitude about Diabetes Denial    Self-care barriers None     Self-management support Doctor's office    Other persons present Patient;Spouse/SO    Patient Concerns Nutrition/Meal planning;Support    Special Needs None    Preferred Learning Style No preference indicated    Learning Readiness Ready    How often do you need to have someone help you when you read instructions, pamphlets, or other written materials from your doctor or pharmacy? 1 - Never    What is the last grade level you completed in school? BS      Pre-Education Assessment   Patient understands the diabetes disease and treatment process. Needs Review    Patient understands incorporating nutritional management into lifestyle. Needs Review    Patient undertands incorporating physical activity into lifestyle. Needs Review    Patient understands using medications safely. Needs Review    Patient understands monitoring blood glucose, interpreting and using results Needs Review    Patient understands prevention, detection, and treatment of acute complications. Needs Review    Patient understands prevention, detection, and treatment of chronic complications. Needs Review    Patient understands how to develop strategies to address psychosocial issues. Needs Review    Patient understands how to develop strategies to promote health/change behavior. Needs Review      Complications   Last HgB A1C per patient/outside source 8.1 %   03/2021 increased from 7.7%  09/2020   How often do you check your blood sugar? 0 times/day (not testing)    Have you had a dilated eye exam in the past 12 months? Yes    Have you had a dental exam in the past 12 months? Yes    Are you checking your feet? No      Dietary Intake   Breakfast SKIPS more frequently [employer] English muffin with Jelly or Honey, egg OR Fritatta and [place] Muffin and occasional canadian bacon OR Cream of Wheat with sugar AND black coffee    Snack (morning) none    Lunch SKIPS OR lunchable OR [employer] (sandwich, fries, frosted lemonade) OR  leftovers    Snack (afternoon) chips and cheese    Dinner chicken or fish or ground [place] or ground beef with salad, sweet potato or small regular potato, cooked vegetable, occasional italian bread    Snack (evening) Loves sweets (ice cream, cookies, candy, chocolate)    Beverage(s) water, black coffee, unsweetened hot green tea, regular lemonade or soda 3 times per week      Exercise   Exercise Type Light (walking / raking leaves)    How many days per week to you exercise? 1    How many minutes per day do you exercise? 20    Total minutes per week of exercise 20      Patient Education   Previous Diabetes Education Yes (please comment)   2017   Disease state  Definition of diabetes, type 1 and 2, and the diagnosis of diabetes    Nutrition management  Role of diet in the treatment of diabetes and the relationship between the three main macronutrients and blood glucose level;Food label reading, portion sizes and measuring food.;Carbohydrate counting;Meal options for control of blood glucose level and chronic complications.;Information on hints to eating out and maintain blood glucose control.   Recomendations for CKD   Medications Reviewed patients medication for diabetes, action, purpose, timing of dose and side effects.    Monitoring Taught/evaluated SMBG meter.;Identified appropriate SMBG and/or A1C goals.;Daily foot exams      Individualized Goals (developed by patient)   Nutrition Follow meal plan discussed;Other (comment)   low sodium, avoid excess protein   Physical Activity Exercise 5-7 days per week;30 minutes per day    Medications take my medication as prescribed    Monitoring  test my blood glucose as discussed    Reducing Risk increase portions of healthy fats      Post-Education Assessment   Patient understands the diabetes disease and treatment process. Demonstrates understanding / competency    Patient understands incorporating nutritional management into lifestyle. Needs  Review    Patient undertands incorporating physical activity into lifestyle. Demonstrates understanding / competency    Patient understands using medications safely. Demonstrates understanding / competency    Patient understands monitoring blood glucose, interpreting and using results Demonstrates understanding / competency    Patient understands prevention, detection, and treatment of acute complications. Demonstrates understanding / competency    Patient understands prevention, detection, and treatment of chronic complications. Demonstrates understanding / competency    Patient understands how to develop strategies to address psychosocial issues. Demonstrates understanding / competency    Patient understands how to develop strategies to promote health/change behavior. Needs Review      Outcomes   Expected Outcomes Demonstrated interest in learning. Expect positive outcomes    Future DMSE 6 months    Program Status Not Completed  Individualized Plan for Diabetes Self-Management Training:   Learning Objective:  Patient will have a greater understanding of diabetes self-management. Patient education plan is to attend individual and/or group sessions per assessed needs and concerns.   Plan:   Patient Instructions  I messaged your doctor for a prescription.  If your insurance does not cover this, you can  FreeStyle Libre  2 or 3 OR Dexcom G6 CGM    I will message prescription for Strips for the Controur Next EZ and lancets for this as well.  Aim to be active 30 minutes most days.  Consider hiring the personal trainer again.  Exercise may give you more energy and will lower your blood sugar.  Mindfulness  Choice Portion size  Low sodium, low protein (60 grams daily) Avoid foods with Phos... added on the ingredient list.  Aim for 3-4 Carb Choices per meal (45-60 grams) +/- 1 either way  Aim for 0-1 Carbs per snack if hungry  Include protein in moderation with your  meals and snacks Consider reading food labels for Total Carbohydrate of foods Consider  increasing your activity level by walking or other for 30 minutes daily as tolerated Consider checking BG at alternate times per day  Consider taking medication as directed by MD      Expected Outcomes:  Demonstrated interest in learning. Expect positive outcomes  Education material provided: ADA - How to Thrive: A Guide for Your Journey with Diabetes, Food label handouts, Snack sheet, and Diabetes Resources, NKD National Kidney Diet placemat for those with CKD not on dialysis  If problems or questions, patient to contact team via:  Phone  Future DSME appointment: 6 months

## 2021-07-28 DIAGNOSIS — L853 Xerosis cutis: Secondary | ICD-10-CM | POA: Diagnosis not present

## 2021-07-28 DIAGNOSIS — Z85828 Personal history of other malignant neoplasm of skin: Secondary | ICD-10-CM | POA: Diagnosis not present

## 2021-07-28 DIAGNOSIS — L814 Other melanin hyperpigmentation: Secondary | ICD-10-CM | POA: Diagnosis not present

## 2021-07-28 DIAGNOSIS — L821 Other seborrheic keratosis: Secondary | ICD-10-CM | POA: Diagnosis not present

## 2021-09-07 DIAGNOSIS — K429 Umbilical hernia without obstruction or gangrene: Secondary | ICD-10-CM | POA: Diagnosis not present

## 2021-09-21 IMAGING — DX DG SHOULDER 2+V*R*
3 series · 3 of 3 positions shown · non-contrast
Comparison: None.

CLINICAL DATA: Right shoulder pain

EXAM:
RIGHT SHOULDER - 2+ VIEW

[shoulder ap (1 of 2)]
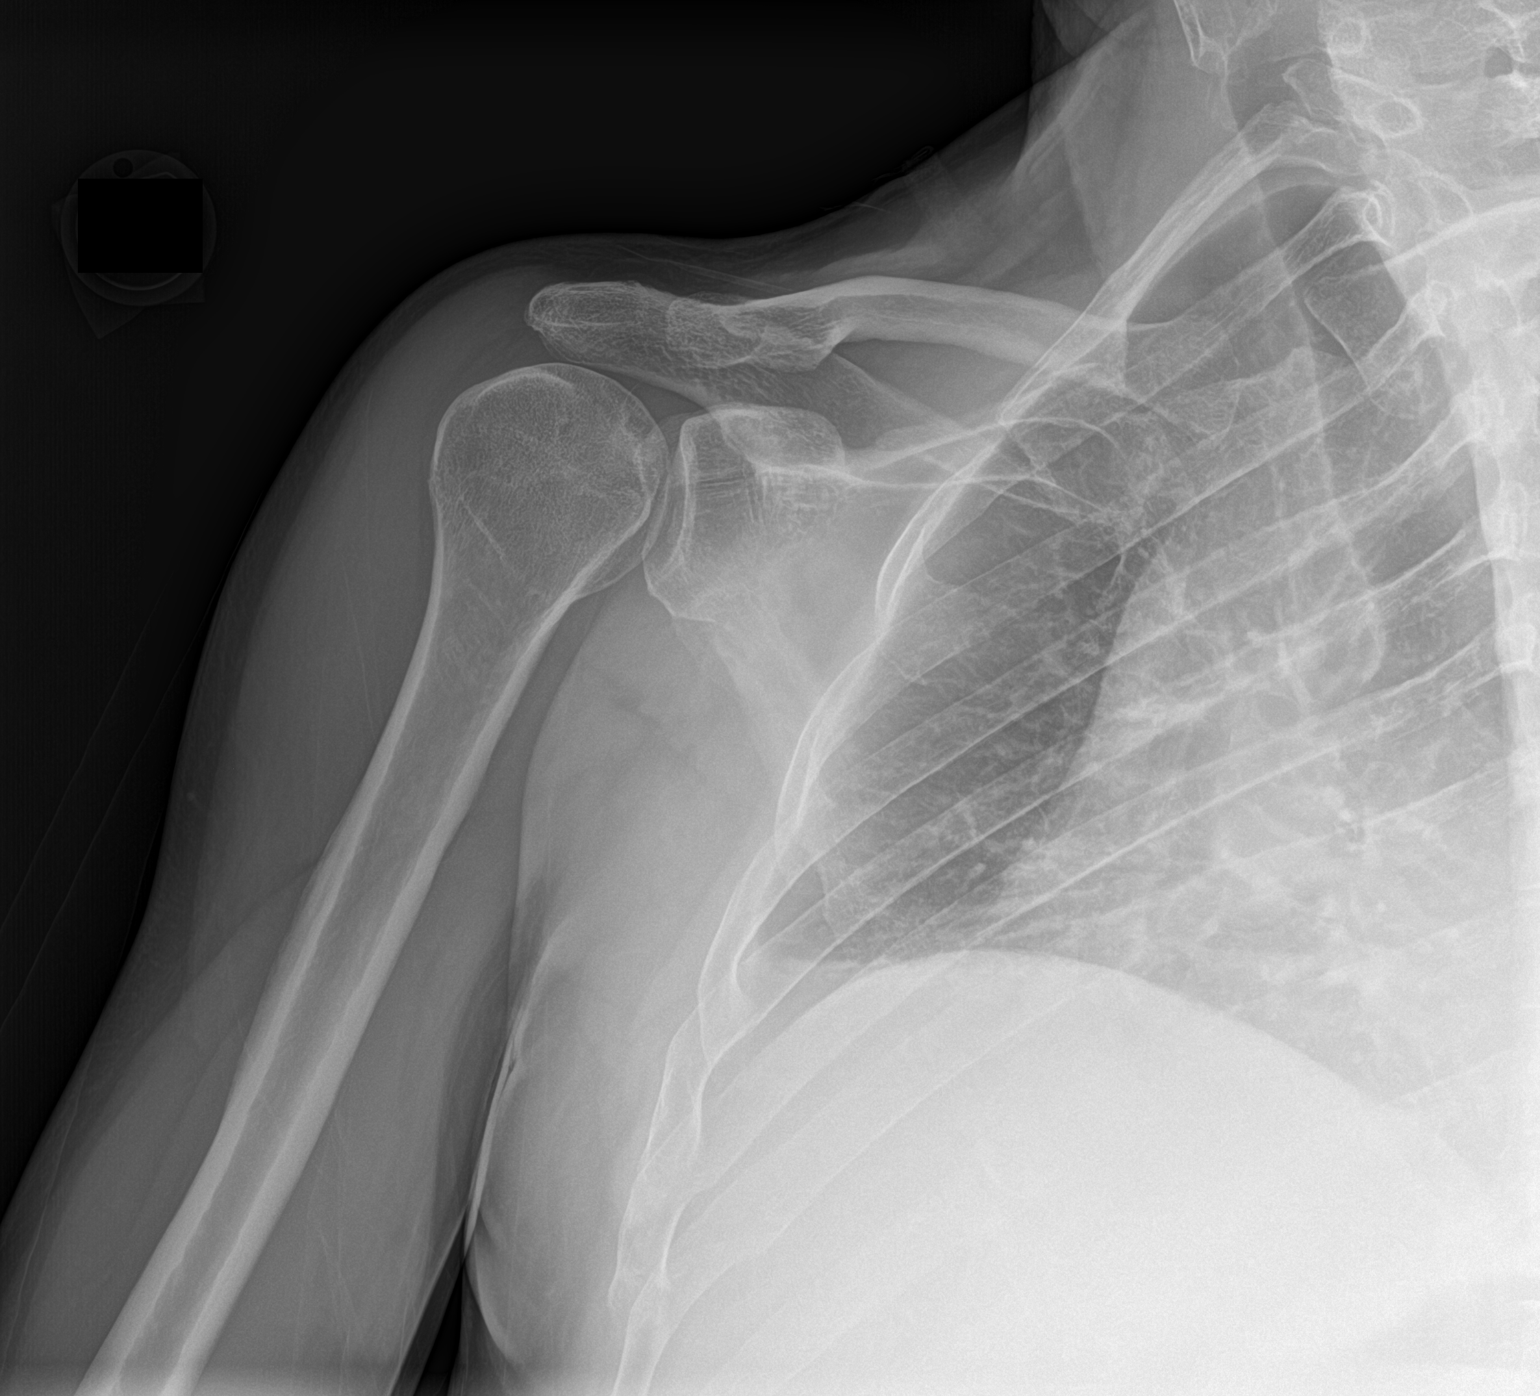

[shoulder axial]
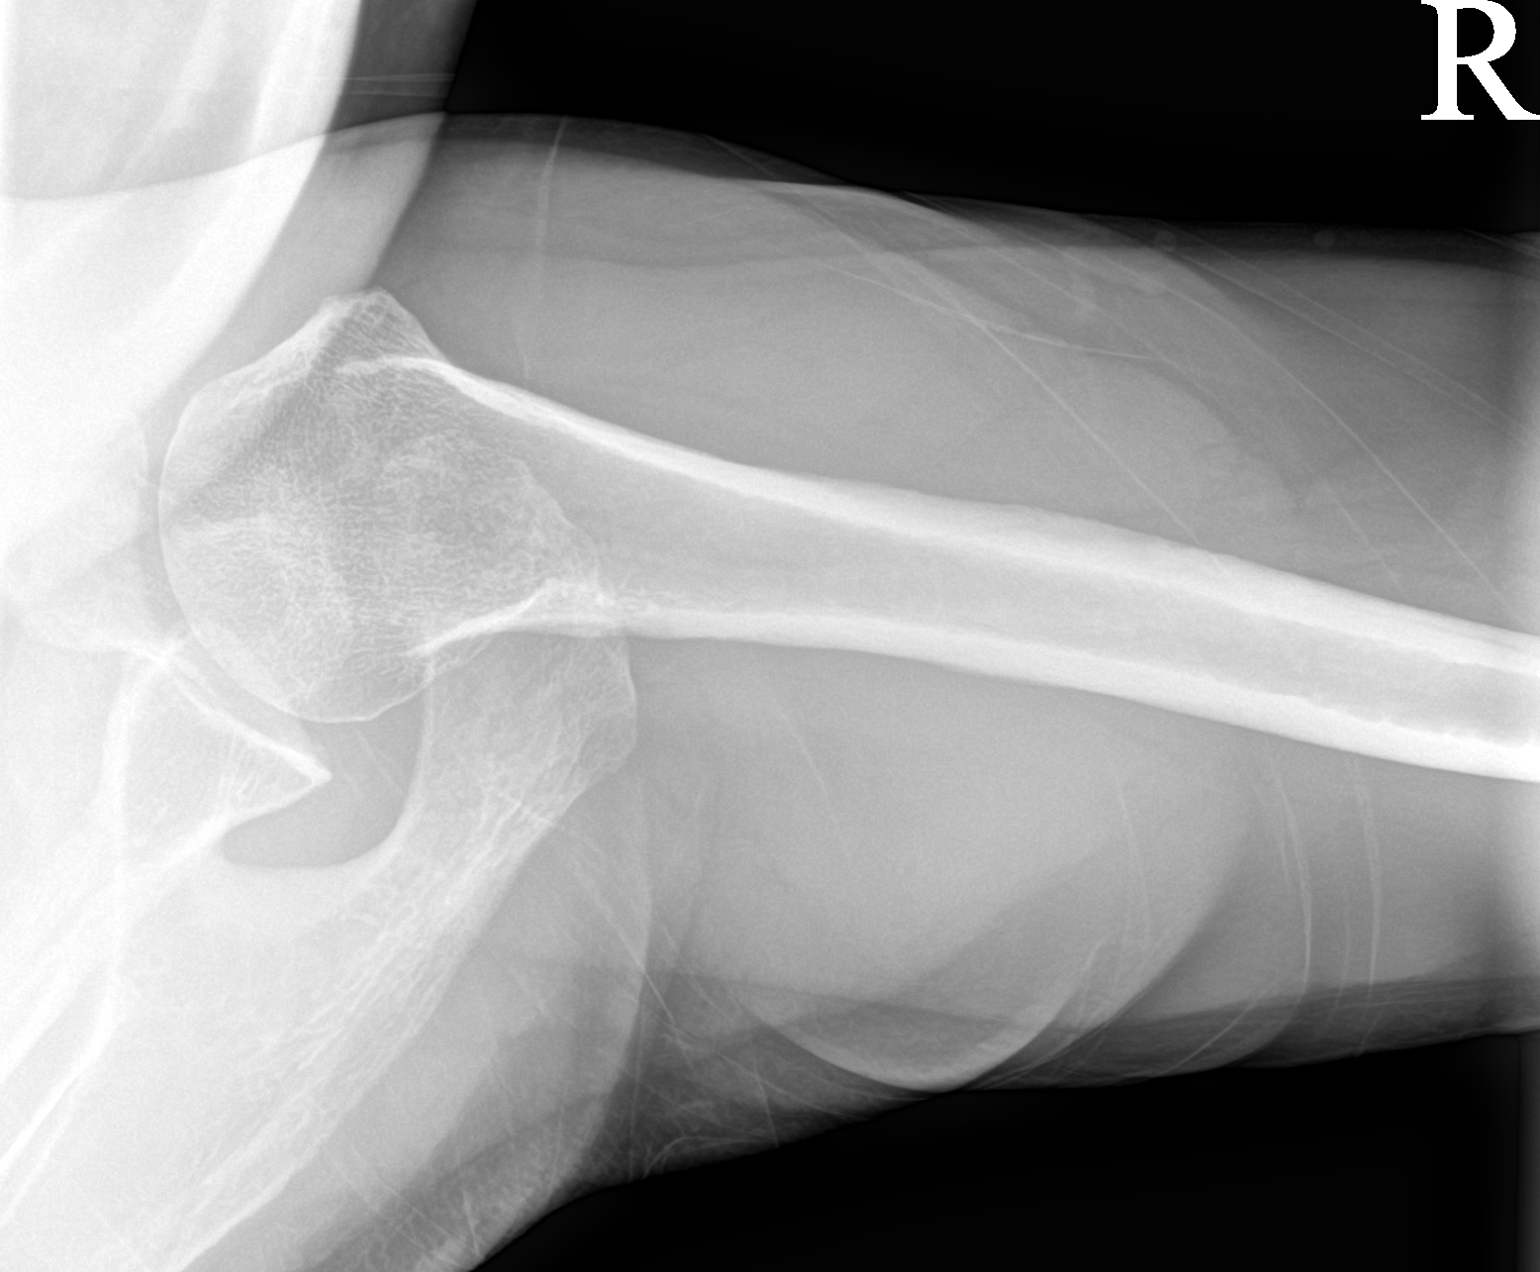

[shoulder ap (2 of 2)]
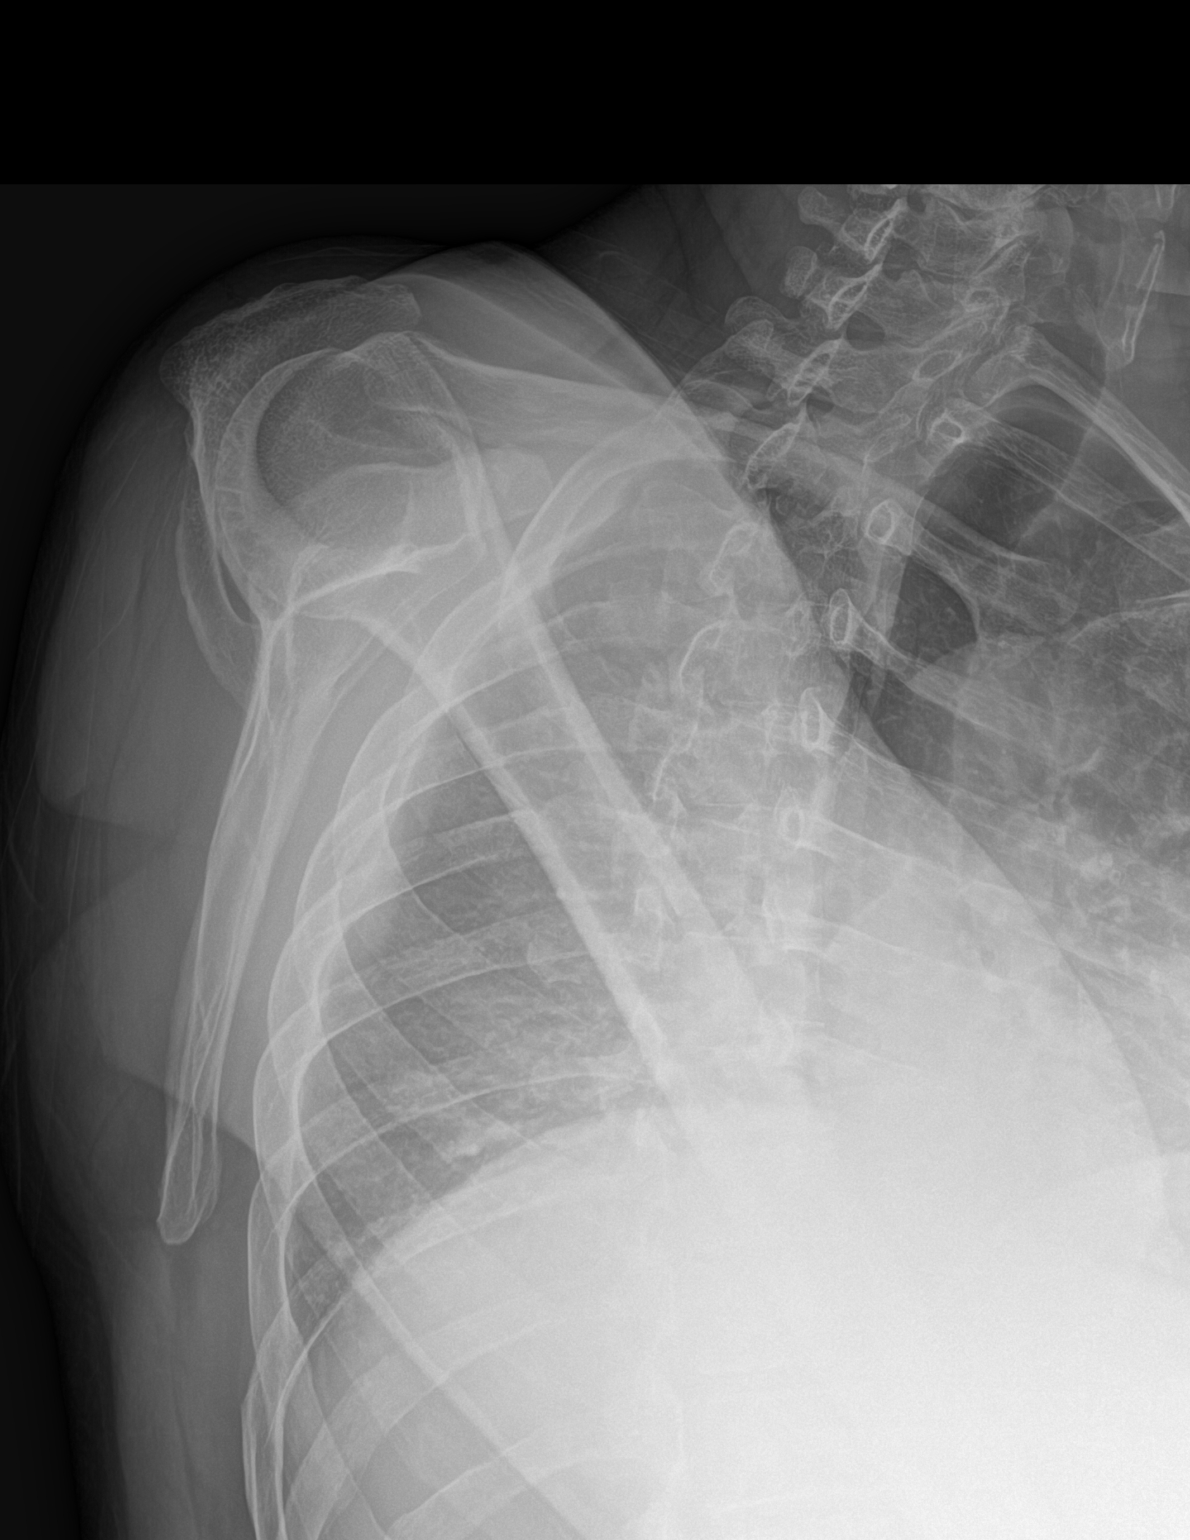

[3 of 3 positions shown; findings below may reference images not displayed]

FINDINGS: There is no evidence of fracture or dislocation. There is no
evidence of arthropathy or other focal bone abnormality. Soft
tissues are unremarkable.
IMPRESSION: Negative.

## 2021-10-16 DIAGNOSIS — Z125 Encounter for screening for malignant neoplasm of prostate: Secondary | ICD-10-CM | POA: Diagnosis not present

## 2021-10-16 DIAGNOSIS — E1129 Type 2 diabetes mellitus with other diabetic kidney complication: Secondary | ICD-10-CM | POA: Diagnosis not present

## 2021-10-23 DIAGNOSIS — Z1331 Encounter for screening for depression: Secondary | ICD-10-CM | POA: Diagnosis not present

## 2021-10-23 DIAGNOSIS — E1129 Type 2 diabetes mellitus with other diabetic kidney complication: Secondary | ICD-10-CM | POA: Diagnosis not present

## 2021-10-23 DIAGNOSIS — R82998 Other abnormal findings in urine: Secondary | ICD-10-CM | POA: Diagnosis not present

## 2021-10-23 DIAGNOSIS — I10 Essential (primary) hypertension: Secondary | ICD-10-CM | POA: Diagnosis not present

## 2021-10-23 DIAGNOSIS — N1831 Chronic kidney disease, stage 3a: Secondary | ICD-10-CM | POA: Diagnosis not present

## 2021-10-23 DIAGNOSIS — I129 Hypertensive chronic kidney disease with stage 1 through stage 4 chronic kidney disease, or unspecified chronic kidney disease: Secondary | ICD-10-CM | POA: Diagnosis not present

## 2021-10-23 DIAGNOSIS — Z0000 Encounter for general adult medical examination without abnormal findings: Secondary | ICD-10-CM | POA: Diagnosis not present

## 2021-10-23 DIAGNOSIS — Z1339 Encounter for screening examination for other mental health and behavioral disorders: Secondary | ICD-10-CM | POA: Diagnosis not present

## 2021-10-29 DIAGNOSIS — Z23 Encounter for immunization: Secondary | ICD-10-CM | POA: Diagnosis not present

## 2021-11-18 DIAGNOSIS — R0981 Nasal congestion: Secondary | ICD-10-CM | POA: Diagnosis not present

## 2021-11-18 DIAGNOSIS — U071 COVID-19: Secondary | ICD-10-CM | POA: Diagnosis not present

## 2021-11-18 DIAGNOSIS — R051 Acute cough: Secondary | ICD-10-CM | POA: Diagnosis not present

## 2021-11-18 IMAGING — XA DG INJECT/[PERSON_NAME] INC NEEDLE/CATH/PLC EPI/CERV/THOR W/IMG
2 series · 2 of 2 positions shown · non-contrast
Comparison: none

CLINICAL DATA: Cervical spine pain. Right-sided radiculopathy.
Displacement of the cervical discs at C3-4 most prominently.
Bilateral foraminal narrowing at C5-6 is worse on the right.

[Series 1: ortho standard · 1 of 1 slices shown (1 of 2)]
[im 1/1]
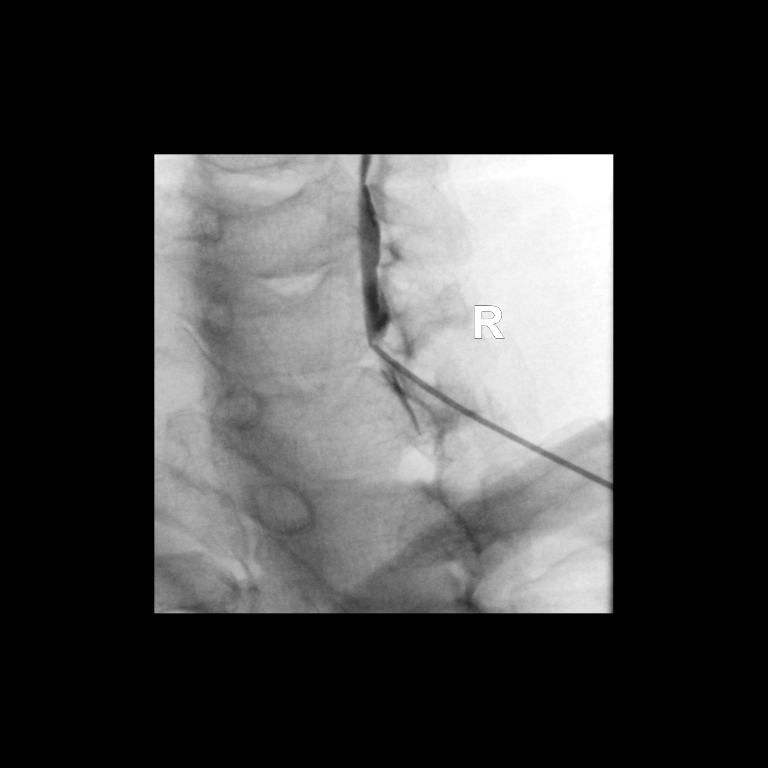

[Series 2: ortho standard · 1 of 1 slices shown (2 of 2)]
[im 1/1]
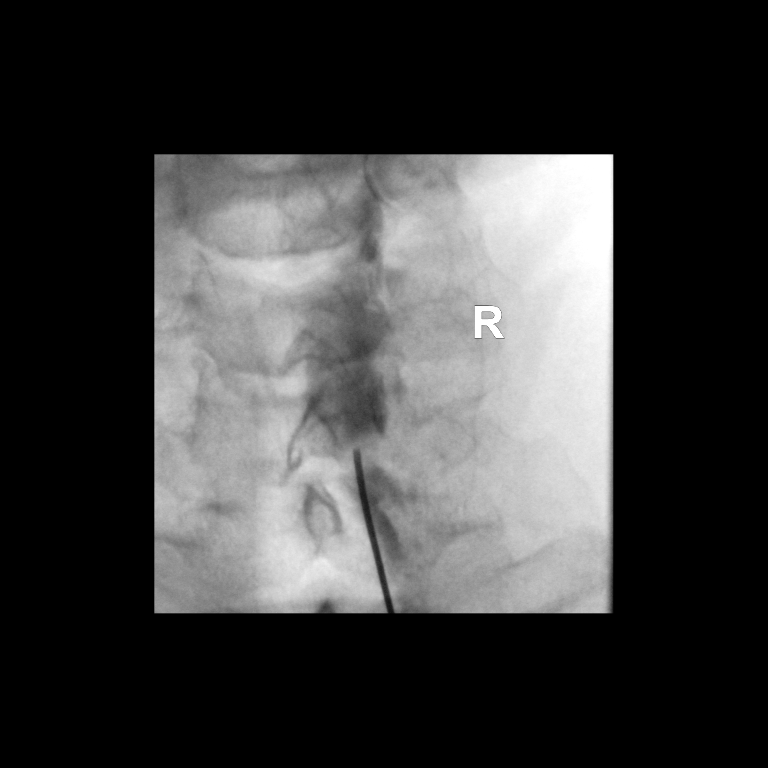

[2 of 2 positions shown; findings below may reference images not displayed]

FLUOROSCOPY TIME:  Radiation Exposure Index (as provided by the
fluoroscopic device): 8.13 uGy*m2

PROCEDURE:
CERVICAL EPIDURAL INJECTION

An interlaminar approach was performed on the right at C6-7. A 20
gauge epidural needle was advanced using loss-of-resistance
technique.

DIAGNOSTIC EPIDURAL INJECTION

Injection of Isovue-M 300 shows a good epidural pattern with spread
above and below the level of needle placement, primarily on the
right. No vascular opacification is seen. THERAPEUTIC

EPIDURAL INJECTION

1.5 ml of Kenalog 40 mixed with 1 ml of 1% Lidocaine and 2 ml of
normal saline were then instilled. The procedure was well-tolerated,
and the patient was discharged thirty minutes following the
injection in good condition.
IMPRESSION: Technically successful first epidural injection on the right at
C6-7.

## 2021-12-28 ENCOUNTER — Ambulatory Visit: Payer: BC Managed Care – PPO | Admitting: Dietician

## 2022-02-16 DIAGNOSIS — L821 Other seborrheic keratosis: Secondary | ICD-10-CM | POA: Diagnosis not present

## 2022-02-16 DIAGNOSIS — L812 Freckles: Secondary | ICD-10-CM | POA: Diagnosis not present

## 2022-02-16 DIAGNOSIS — D1801 Hemangioma of skin and subcutaneous tissue: Secondary | ICD-10-CM | POA: Diagnosis not present

## 2022-02-16 DIAGNOSIS — Z85828 Personal history of other malignant neoplasm of skin: Secondary | ICD-10-CM | POA: Diagnosis not present

## 2022-04-12 DIAGNOSIS — D485 Neoplasm of uncertain behavior of skin: Secondary | ICD-10-CM | POA: Diagnosis not present

## 2022-04-12 DIAGNOSIS — H0279 Other degenerative disorders of eyelid and periocular area: Secondary | ICD-10-CM | POA: Diagnosis not present

## 2022-05-03 DIAGNOSIS — I129 Hypertensive chronic kidney disease with stage 1 through stage 4 chronic kidney disease, or unspecified chronic kidney disease: Secondary | ICD-10-CM | POA: Diagnosis not present

## 2022-05-03 DIAGNOSIS — E1129 Type 2 diabetes mellitus with other diabetic kidney complication: Secondary | ICD-10-CM | POA: Diagnosis not present

## 2022-05-03 DIAGNOSIS — Z23 Encounter for immunization: Secondary | ICD-10-CM | POA: Diagnosis not present

## 2022-05-28 DIAGNOSIS — L820 Inflamed seborrheic keratosis: Secondary | ICD-10-CM | POA: Diagnosis not present

## 2022-05-28 DIAGNOSIS — L929 Granulomatous disorder of the skin and subcutaneous tissue, unspecified: Secondary | ICD-10-CM | POA: Diagnosis not present

## 2022-05-28 DIAGNOSIS — D23122 Other benign neoplasm of skin of left lower eyelid, including canthus: Secondary | ICD-10-CM | POA: Diagnosis not present

## 2022-05-28 DIAGNOSIS — H04561 Stenosis of right lacrimal punctum: Secondary | ICD-10-CM | POA: Diagnosis not present

## 2022-05-28 DIAGNOSIS — D485 Neoplasm of uncertain behavior of skin: Secondary | ICD-10-CM | POA: Diagnosis not present

## 2022-08-03 DIAGNOSIS — E785 Hyperlipidemia, unspecified: Secondary | ICD-10-CM | POA: Diagnosis not present

## 2022-08-03 DIAGNOSIS — N1831 Chronic kidney disease, stage 3a: Secondary | ICD-10-CM | POA: Diagnosis not present

## 2022-08-03 DIAGNOSIS — I129 Hypertensive chronic kidney disease with stage 1 through stage 4 chronic kidney disease, or unspecified chronic kidney disease: Secondary | ICD-10-CM | POA: Diagnosis not present

## 2022-08-03 DIAGNOSIS — E1129 Type 2 diabetes mellitus with other diabetic kidney complication: Secondary | ICD-10-CM | POA: Diagnosis not present

## 2022-08-08 DIAGNOSIS — H1031 Unspecified acute conjunctivitis, right eye: Secondary | ICD-10-CM | POA: Diagnosis not present

## 2022-08-08 DIAGNOSIS — J069 Acute upper respiratory infection, unspecified: Secondary | ICD-10-CM | POA: Diagnosis not present

## 2022-08-08 DIAGNOSIS — R051 Acute cough: Secondary | ICD-10-CM | POA: Diagnosis not present

## 2022-08-17 DIAGNOSIS — L820 Inflamed seborrheic keratosis: Secondary | ICD-10-CM | POA: Diagnosis not present

## 2022-08-17 DIAGNOSIS — L570 Actinic keratosis: Secondary | ICD-10-CM | POA: Diagnosis not present

## 2022-08-17 DIAGNOSIS — L812 Freckles: Secondary | ICD-10-CM | POA: Diagnosis not present

## 2022-08-17 DIAGNOSIS — Z85828 Personal history of other malignant neoplasm of skin: Secondary | ICD-10-CM | POA: Diagnosis not present

## 2022-08-17 DIAGNOSIS — D1801 Hemangioma of skin and subcutaneous tissue: Secondary | ICD-10-CM | POA: Diagnosis not present

## 2022-08-17 DIAGNOSIS — L821 Other seborrheic keratosis: Secondary | ICD-10-CM | POA: Diagnosis not present

## 2022-11-04 DIAGNOSIS — I10 Essential (primary) hypertension: Secondary | ICD-10-CM | POA: Diagnosis not present

## 2022-11-04 DIAGNOSIS — Z125 Encounter for screening for malignant neoplasm of prostate: Secondary | ICD-10-CM | POA: Diagnosis not present

## 2022-11-04 DIAGNOSIS — R7989 Other specified abnormal findings of blood chemistry: Secondary | ICD-10-CM | POA: Diagnosis not present

## 2022-11-04 DIAGNOSIS — E1129 Type 2 diabetes mellitus with other diabetic kidney complication: Secondary | ICD-10-CM | POA: Diagnosis not present

## 2022-11-04 DIAGNOSIS — E785 Hyperlipidemia, unspecified: Secondary | ICD-10-CM | POA: Diagnosis not present

## 2022-11-11 DIAGNOSIS — Z1331 Encounter for screening for depression: Secondary | ICD-10-CM | POA: Diagnosis not present

## 2022-11-11 DIAGNOSIS — I129 Hypertensive chronic kidney disease with stage 1 through stage 4 chronic kidney disease, or unspecified chronic kidney disease: Secondary | ICD-10-CM | POA: Diagnosis not present

## 2022-11-11 DIAGNOSIS — E785 Hyperlipidemia, unspecified: Secondary | ICD-10-CM | POA: Diagnosis not present

## 2022-11-11 DIAGNOSIS — E1129 Type 2 diabetes mellitus with other diabetic kidney complication: Secondary | ICD-10-CM | POA: Diagnosis not present

## 2022-11-11 DIAGNOSIS — Z0000 Encounter for general adult medical examination without abnormal findings: Secondary | ICD-10-CM | POA: Diagnosis not present

## 2022-11-11 DIAGNOSIS — R82998 Other abnormal findings in urine: Secondary | ICD-10-CM | POA: Diagnosis not present

## 2022-11-11 DIAGNOSIS — N1831 Chronic kidney disease, stage 3a: Secondary | ICD-10-CM | POA: Diagnosis not present

## 2022-11-11 DIAGNOSIS — Z1339 Encounter for screening examination for other mental health and behavioral disorders: Secondary | ICD-10-CM | POA: Diagnosis not present

## 2023-02-01 DIAGNOSIS — K4090 Unilateral inguinal hernia, without obstruction or gangrene, not specified as recurrent: Secondary | ICD-10-CM | POA: Diagnosis not present

## 2023-02-21 ENCOUNTER — Ambulatory Visit: Payer: Self-pay | Admitting: General Surgery

## 2023-02-21 DIAGNOSIS — K4090 Unilateral inguinal hernia, without obstruction or gangrene, not specified as recurrent: Secondary | ICD-10-CM | POA: Diagnosis not present

## 2023-02-21 NOTE — H&P (Signed)
Chief Complaint: Follow-up (Eval of [facility])       History of Present Illness: Dustin Allen is a 62 y.o. male who is seen today as an office consultation at the request of Dr. Kallie Locks for evaluation of Follow-up (Eval of [facility] .     Patient is a 62 year old male who comes in secondary to right inguinal hernia.  He states this has been there for the last several weeks.  He states he is having significant pain.  He is wearing a hernia truss which seems to be helping.   He had no signs or symptoms of incarceration or strangulation.       Review of Systems: A complete review of systems was obtained from the patient.  I have reviewed this information and discussed as appropriate with the patient.  See HPI as well for other ROS.   Review of Systems  Constitutional:  Negative for fever.  HENT:  Negative for congestion.   Eyes:  Negative for blurred vision.  Respiratory:  Negative for cough, shortness of breath and wheezing.   Cardiovascular:  Negative for chest pain and palpitations.  Gastrointestinal:  Negative for heartburn.  Genitourinary:  Negative for dysuria.  Musculoskeletal:  Negative for myalgias.  Skin:  Negative for rash.  Neurological:  Negative for dizziness and headaches.  Psychiatric/Behavioral:  Negative for depression and suicidal ideas.   All other systems reviewed and are negative.       Medical History: Past Medical History      Past Medical History:  Diagnosis Date   Diabetes mellitus without complication (CMS/HHS-HCC)           Problem List  There is no problem list on file for this patient.      Past Surgical History  History reviewed. No pertinent surgical history.      Allergies  No Known Allergies     Medications Ordered Prior to Encounter        Current Outpatient Medications on File Prior to Visit  Medication Sig Dispense Refill   empagliflozin (JARDIANCE) 25 mg tablet 1 tablet       metFORMIN (GLUCOPHAGE-XR) 500 MG XR tablet Take 2  tablets (1,000 mg total) by mouth 2 (two) times daily       semaglutide (OZEMPIC) 2 mg/dose (8 mg/3 mL) pen injector Inject subcutaneously as directed       atorvastatin (LIPITOR) 20 MG tablet Take 1 tablet (20 mg total) by mouth at bedtime (Patient not taking: Reported on 02/21/2023)       escitalopram oxalate (LEXAPRO) 10 MG tablet Take 1 tablet (10 mg total) by mouth once daily (Patient not taking: Reported on 02/21/2023)       irbesartan (AVAPRO) 75 MG tablet Take 1 tablet (75 mg total) by mouth once daily (Patient not taking: Reported on 02/21/2023)        No current facility-administered medications on file prior to visit.        Family History  History reviewed. No pertinent family history.      Tobacco Use History  Social History       Tobacco Use  Smoking Status Never  Smokeless Tobacco Never        Social History  Social History        Socioeconomic History   Marital status: Married  Tobacco Use   Smoking status: Never   Smokeless tobacco: Never  Vaping Use   Vaping status: Never Used  Substance and Sexual Activity   Alcohol use: Never  Drug use: Never        Objective:         Vitals:    02/21/23 1448  PainSc:   2  PainLoc: Abdomen    There is no height or weight on file to calculate BMI. Physical Exam Constitutional:      Appearance: Normal appearance.  HENT:     Head: Normocephalic and atraumatic.     Nose: Nose normal. No congestion.     Mouth/Throat:     Mouth: Mucous membranes are moist.     Pharynx: Oropharynx is clear.  Eyes:     Pupils: Pupils are equal, round, and reactive to light.  Cardiovascular:     Rate and Rhythm: Normal rate and regular rhythm.     Pulses: Normal pulses.     Heart sounds: Normal heart sounds. No murmur heard.    No friction rub. No gallop.  Pulmonary:     Effort: Pulmonary effort is normal. No respiratory distress.     Breath sounds: Normal breath sounds. No stridor. No wheezing, rhonchi or rales.  Abdominal:      General: Abdomen is flat.     Hernia: A hernia is present. Hernia is present in the left inguinal area and right inguinal area.  Musculoskeletal:        General: Normal range of motion.     Cervical back: Normal range of motion.  Skin:    General: Skin is warm and dry.  Neurological:     General: No focal deficit present.     Mental Status: He is alert and oriented to person, place, and time.  Psychiatric:        Mood and Affect: Mood normal.        Thought Content: Thought content normal.          Assessment and Plan:  Diagnoses and all orders for this visit:   Unilateral inguinal hernia without obstruction or gangrene, recurrence not specified     Dustin Allen is a 62 y.o. male     We will proceed to the OR for a laparoscopic right inguinal hernia repair with mesh. All risks and benefits were discussed with the patient, to generally include infection, bleeding, damage to surrounding structures, acute and chronic nerve pain, and recurrence. Alternatives were offered and described.  All questions were answered and the patient voiced understanding of the procedure and wishes to proceed at this point.             No follow-ups on file.   Axel Filler, MD, [facility], [place] [facility]

## 2023-02-21 NOTE — H&P (View-Only) (Signed)
Chief Complaint: Follow-up (Eval of [facility])       History of Present Illness: Dustin Allen is a 62 y.o. male who is seen today as an office consultation at the request of Dr. Kallie Locks for evaluation of Follow-up (Eval of [facility] .     Patient is a 62 year old male who comes in secondary to right inguinal hernia.  He states this has been there for the last several weeks.  He states he is having significant pain.  He is wearing a hernia truss which seems to be helping.   He had no signs or symptoms of incarceration or strangulation.       Review of Systems: A complete review of systems was obtained from the patient.  I have reviewed this information and discussed as appropriate with the patient.  See HPI as well for other ROS.   Review of Systems  Constitutional:  Negative for fever.  HENT:  Negative for congestion.   Eyes:  Negative for blurred vision.  Respiratory:  Negative for cough, shortness of breath and wheezing.   Cardiovascular:  Negative for chest pain and palpitations.  Gastrointestinal:  Negative for heartburn.  Genitourinary:  Negative for dysuria.  Musculoskeletal:  Negative for myalgias.  Skin:  Negative for rash.  Neurological:  Negative for dizziness and headaches.  Psychiatric/Behavioral:  Negative for depression and suicidal ideas.   All other systems reviewed and are negative.       Medical History: Past Medical History      Past Medical History:  Diagnosis Date   Diabetes mellitus without complication (CMS/HHS-HCC)           Problem List  There is no problem list on file for this patient.      Past Surgical History  History reviewed. No pertinent surgical history.      Allergies  No Known Allergies     Medications Ordered Prior to Encounter        Current Outpatient Medications on File Prior to Visit  Medication Sig Dispense Refill   empagliflozin (JARDIANCE) 25 mg tablet 1 tablet       metFORMIN (GLUCOPHAGE-XR) 500 MG XR tablet Take 2  tablets (1,000 mg total) by mouth 2 (two) times daily       semaglutide (OZEMPIC) 2 mg/dose (8 mg/3 mL) pen injector Inject subcutaneously as directed       atorvastatin (LIPITOR) 20 MG tablet Take 1 tablet (20 mg total) by mouth at bedtime (Patient not taking: Reported on 02/21/2023)       escitalopram oxalate (LEXAPRO) 10 MG tablet Take 1 tablet (10 mg total) by mouth once daily (Patient not taking: Reported on 02/21/2023)       irbesartan (AVAPRO) 75 MG tablet Take 1 tablet (75 mg total) by mouth once daily (Patient not taking: Reported on 02/21/2023)        No current facility-administered medications on file prior to visit.        Family History  History reviewed. No pertinent family history.      Tobacco Use History  Social History       Tobacco Use  Smoking Status Never  Smokeless Tobacco Never        Social History  Social History        Socioeconomic History   Marital status: Married  Tobacco Use   Smoking status: Never   Smokeless tobacco: Never  Vaping Use   Vaping status: Never Used  Substance and Sexual Activity   Alcohol use: Never  Drug use: Never        Objective:         Vitals:    02/21/23 1448  PainSc:   2  PainLoc: Abdomen    There is no height or weight on file to calculate BMI. Physical Exam Constitutional:      Appearance: Normal appearance.  HENT:     Head: Normocephalic and atraumatic.     Nose: Nose normal. No congestion.     Mouth/Throat:     Mouth: Mucous membranes are moist.     Pharynx: Oropharynx is clear.  Eyes:     Pupils: Pupils are equal, round, and reactive to light.  Cardiovascular:     Rate and Rhythm: Normal rate and regular rhythm.     Pulses: Normal pulses.     Heart sounds: Normal heart sounds. No murmur heard.    No friction rub. No gallop.  Pulmonary:     Effort: Pulmonary effort is normal. No respiratory distress.     Breath sounds: Normal breath sounds. No stridor. No wheezing, rhonchi or rales.  Abdominal:      General: Abdomen is flat.     Hernia: A hernia is present. Hernia is present in the left inguinal area and right inguinal area.  Musculoskeletal:        General: Normal range of motion.     Cervical back: Normal range of motion.  Skin:    General: Skin is warm and dry.  Neurological:     General: No focal deficit present.     Mental Status: He is alert and oriented to person, place, and time.  Psychiatric:        Mood and Affect: Mood normal.        Thought Content: Thought content normal.          Assessment and Plan:  Diagnoses and all orders for this visit:   Unilateral inguinal hernia without obstruction or gangrene, recurrence not specified     Dustin Allen is a 62 y.o. male     We will proceed to the OR for a laparoscopic right inguinal hernia repair with mesh. All risks and benefits were discussed with the patient, to generally include infection, bleeding, damage to surrounding structures, acute and chronic nerve pain, and recurrence. Alternatives were offered and described.  All questions were answered and the patient voiced understanding of the procedure and wishes to proceed at this point.             No follow-ups on file.   Axel Filler, MD, [facility], [place] [facility]

## 2023-02-23 ENCOUNTER — Encounter (HOSPITAL_COMMUNITY): Payer: Self-pay | Admitting: General Surgery

## 2023-02-23 ENCOUNTER — Other Ambulatory Visit: Payer: Self-pay

## 2023-02-23 NOTE — Progress Notes (Addendum)
PCP - Martha Clan Cardiologist - Denies  PPM/ICD - denies Device Orders - n/a Rep Notified - n/a  Chest x-ray - denies EKG - States EKG done [employer] will request copy Stress Test - n/a ECHO - n/a Cardiac Cath - n/a  CPAP - n/a  GLP-1 -Ozempic Last dose 02-23-23  Fasting Blood Sugar - Patient states around 100 Checks Blood Sugar /day dexacom Left arm  Blood Thinner Instructions: denies Aspirin Instructions: n/a  ERAS Protcol - ERAS No drink clear liquids until 11:00 A.M.  COVID TEST- n/a  Anesthesia review: Yes HNT, DM  Patient verbally denies any shortness of breath, fever, cough and chest pain during phone call   -------------  SDW INSTRUCTIONS given:  Your procedure is scheduled on . Sept 12,2024  Report to [facility] Main Entrance "A" at 11:30 A.M., and check in at the Admitting office.  Call this number if you have problems the morning of surgery:  [phone]   Remember:  Do not eat after midnight the night before your surgery  You may drink clear liquids until 11:00 A.M.  the morning of your surgery.   Clear liquids allowed are: Water, Non-Citrus Juices (without pulp), Carbonated Beverages, Clear Tea, Black Coffee Only, and Gatorade    Take these medicines the morning of surgery with A SIP OF WATER  N/A  As of today, STOP taking any Aspirin (unless otherwise instructed by your surgeon) Aleve, Naproxen, Ibuprofen, Motrin, Advil, Goody's, BC's, all herbal medications, fish oil, and all vitamins.  WHAT DO I DO ABOUT MY DIABETES MEDICATION?   Do not take oral diabetes medicines (pills) the morning of surgery. empagliflozin (JARDIANCE)  metFORMIN (GLUCOPHAGE-XR)   The day of surgery, do not take other diabetes injectables, including Byetta (exenatide), Bydureon (exenatide ER), Victoza (liraglutide), or Trulicity (dulaglutide). SEMAGLUTIDE-  LAST DOSE 02-23-23   If your CBG is greater than 220 mg/dL, you may take  of your sliding scale (correction)  dose of insulin.   HOW TO MANAGE YOUR DIABETES BEFORE AND AFTER SURGERY  Why is it important to control my blood sugar before and after surgery? Improving blood sugar levels before and after surgery helps healing and can limit problems. A way of improving blood sugar control is eating a healthy diet by:  Eating less sugar and carbohydrates  Increasing activity/exercise  Talking with your doctor about reaching your blood sugar goals High blood sugars (greater than 180 mg/dL) can raise your risk of infections and slow your recovery, so you will need to focus on controlling your diabetes during the weeks before surgery. Make sure that the doctor who takes care of your diabetes knows about your planned surgery including the date and location.  How do I manage my blood sugar before surgery? Check your blood sugar at least 4 times a day, starting 2 days before surgery, to make sure that the level is not too high or low.  Check your blood sugar the morning of your surgery when you wake up and every 2 hours until you get to the Short Stay unit.  If your blood sugar is less than 70 mg/dL, you will need to treat for low blood sugar: Do not take insulin. Treat a low blood sugar (less than 70 mg/dL) with  cup of clear juice (cranberry or apple), 4 glucose tablets, OR glucose gel. Recheck blood sugar in 15 minutes after treatment (to make sure it is greater than 70 mg/dL). If your blood sugar is not greater than 70 mg/dL  on recheck, call [phone] for further instructions. Report your blood sugar to the short stay nurse when you get to Short Stay.  If you are admitted to the hospital after surgery: Your blood sugar will be checked by the staff and you will probably be given insulin after surgery (instead of oral diabetes medicines) to make sure you have good blood sugar levels. The goal for blood sugar control after surgery is 80-180 mg/dL.                       Do not wear jewelry, make up, or  nail polish            Do not wear lotions, powders, perfumes/colognes, or deodorant.            Do not shave 48 hours prior to surgery.  Men may shave face and neck.            Do not bring valuables to the hospital.            [facility] is not responsible for any belongings or valuables.  Do NOT Smoke (Tobacco/Vaping) 24 hours prior to your procedure If you use a CPAP at night, you may bring all equipment for your overnight stay.   Contacts, glasses, dentures or bridgework may not be worn into surgery.      For patients admitted to the hospital, discharge time will be determined by your treatment team.   Patients discharged the day of surgery will not be allowed to drive home, and someone needs to stay with them for 24 hours.    Special instructions:   [place]- Preparing For Surgery  Before surgery, you can play an important role. Because skin is not sterile, your skin needs to be as free of germs as possible. You can reduce the number of germs on your skin by washing with CHG (chlorahexidine gluconate) Soap before surgery.  CHG is an antiseptic cleaner which kills germs and bonds with the skin to continue killing germs even after washing.    Oral Hygiene is also important to reduce your risk of infection.  Remember - BRUSH YOUR TEETH THE MORNING OF SURGERY WITH YOUR REGULAR TOOTHPASTE  Please do not use if you have an allergy to CHG or antibacterial soaps. If your skin becomes reddened/irritated stop using the CHG.  Do not shave (including legs and underarms) for at least 48 hours prior to first CHG shower. It is OK to shave your face.  Please follow these instructions carefully.   Shower the NIGHT BEFORE SURGERY and the MORNING OF SURGERY with DIAL Soap.   Pat yourself dry with a CLEAN TOWEL.  Wear CLEAN PAJAMAS to bed the night before surgery  Place CLEAN SHEETS on your bed the night of your first shower and DO NOT SLEEP WITH PETS.   Day of Surgery: Please shower  morning of surgery  Wear Clean/Comfortable clothing the morning of surgery Do not apply any deodorants/lotions.   Remember to brush your teeth WITH YOUR REGULAR TOOTHPASTE.   Questions were answered. Patient verbalized understanding of instructions.

## 2023-02-24 ENCOUNTER — Encounter (HOSPITAL_COMMUNITY): Payer: Self-pay | Admitting: General Surgery

## 2023-02-24 ENCOUNTER — Ambulatory Visit (HOSPITAL_COMMUNITY)
Admission: RE | Admit: 2023-02-24 | Discharge: 2023-02-24 | Disposition: A | Discharge status: Home / Self Care | Payer: BC Managed Care – PPO | Attending: General Surgery | Admitting: General Surgery

## 2023-02-24 ENCOUNTER — Ambulatory Visit (HOSPITAL_COMMUNITY): Payer: BC Managed Care – PPO | Admitting: Physician Assistant

## 2023-02-24 ENCOUNTER — Encounter (HOSPITAL_COMMUNITY)
Admission: RE | Disposition: A | Discharge status: Home / Self Care | Payer: Self-pay | Source: Home / Self Care | Attending: General Surgery

## 2023-02-24 ENCOUNTER — Ambulatory Visit (HOSPITAL_COMMUNITY): Payer: Self-pay | Admitting: Physician Assistant

## 2023-02-24 DIAGNOSIS — K4090 Unilateral inguinal hernia, without obstruction or gangrene, not specified as recurrent: Secondary | ICD-10-CM | POA: Insufficient documentation

## 2023-02-24 DIAGNOSIS — L723 Sebaceous cyst: Secondary | ICD-10-CM | POA: Diagnosis not present

## 2023-02-24 DIAGNOSIS — E119 Type 2 diabetes mellitus without complications: Secondary | ICD-10-CM | POA: Diagnosis not present

## 2023-02-24 DIAGNOSIS — L821 Other seborrheic keratosis: Secondary | ICD-10-CM | POA: Diagnosis not present

## 2023-02-24 DIAGNOSIS — I10 Essential (primary) hypertension: Secondary | ICD-10-CM | POA: Insufficient documentation

## 2023-02-24 DIAGNOSIS — L853 Xerosis cutis: Secondary | ICD-10-CM | POA: Diagnosis not present

## 2023-02-24 DIAGNOSIS — Z87891 Personal history of nicotine dependence: Secondary | ICD-10-CM | POA: Diagnosis not present

## 2023-02-24 DIAGNOSIS — Z7984 Long term (current) use of oral hypoglycemic drugs: Secondary | ICD-10-CM | POA: Insufficient documentation

## 2023-02-24 DIAGNOSIS — Z85828 Personal history of other malignant neoplasm of skin: Secondary | ICD-10-CM | POA: Diagnosis not present

## 2023-02-24 HISTORY — PX: INGUINAL HERNIA REPAIR: SHX194

## 2023-02-24 LAB — POCT I-STAT, CHEM 8
BUN: 23 mg/dL (ref 8–23)
Calcium, Ion: 1.13 mmol/L — ABNORMAL LOW (ref 1.15–1.40)
Chloride: 106 mmol/L (ref 98–111)
Creatinine, Ser: 1.3 mg/dL — ABNORMAL HIGH (ref 0.61–1.24)
Glucose, Bld: 91 mg/dL (ref 70–99)
HCT: 43 % (ref 39.0–52.0)
Hemoglobin: 14.6 g/dL (ref 13.0–17.0)
Potassium: 5.9 mmol/L — ABNORMAL HIGH (ref 3.5–5.1)
Sodium: 137 mmol/L (ref 135–145)
TCO2: 23 mmol/L (ref 22–32)

## 2023-02-24 LAB — GLUCOSE, CAPILLARY
Glucose-Capillary: 86 mg/dL (ref 70–99)
Glucose-Capillary: 140 mg/dL — ABNORMAL HIGH (ref 70–99)

## 2023-02-24 SURGERY — REPAIR, HERNIA, INGUINAL, LAPAROSCOPIC
Anesthesia: General | Site: Abdomen | Laterality: Right

## 2023-02-24 MED ORDER — EPHEDRINE SULFATE-NACL 50-0.9 MG/10ML-% IV SOSY
PREFILLED_SYRINGE | INTRAVENOUS | Status: DC | PRN
  Administered 2023-02-24: 10 mg via INTRAVENOUS

## 2023-02-24 MED ORDER — CEFAZOLIN SODIUM-DEXTROSE 2-4 GM/100ML-% IV SOLN
2.0000 g | INTRAVENOUS | Status: AC
  Administered 2023-02-24: 2 g via INTRAVENOUS
  Filled 2023-02-24: qty 100

## 2023-02-24 MED ORDER — ROCURONIUM BROMIDE 10 MG/ML (PF) SYRINGE
PREFILLED_SYRINGE | INTRAVENOUS | Status: DC | PRN
  Administered 2023-02-24: 40 mg via INTRAVENOUS

## 2023-02-24 MED ORDER — ENSURE PRE-SURGERY PO LIQD: 296.0000 mL | Freq: Once | ORAL | Status: DC

## 2023-02-24 MED ORDER — KETOROLAC TROMETHAMINE 30 MG/ML IJ SOLN
INTRAMUSCULAR | Status: DC | PRN
Start: 2023-02-24 — End: 2023-02-24
  Administered 2023-02-24: 30 mg via INTRAVENOUS

## 2023-02-24 MED ORDER — SUCCINYLCHOLINE CHLORIDE 200 MG/10ML IV SOSY
PREFILLED_SYRINGE | INTRAVENOUS | Status: DC | PRN
  Administered 2023-02-24: 100 mg via INTRAVENOUS

## 2023-02-24 MED ORDER — ONDANSETRON HCL 4 MG/2ML IJ SOLN
INTRAMUSCULAR | Status: DC | PRN
  Administered 2023-02-24: 4 mg via INTRAVENOUS

## 2023-02-24 MED ORDER — ROCURONIUM BROMIDE 10 MG/ML (PF) SYRINGE
PREFILLED_SYRINGE | INTRAVENOUS | Status: AC
  Filled 2023-02-24: qty 10

## 2023-02-24 MED ORDER — PHENYLEPHRINE 80 MCG/ML (10ML) SYRINGE FOR IV PUSH (FOR BLOOD PRESSURE SUPPORT)
PREFILLED_SYRINGE | INTRAVENOUS | Status: AC
  Filled 2023-02-24: qty 10

## 2023-02-24 MED ORDER — FENTANYL CITRATE (PF) 250 MCG/5ML IJ SOLN
INTRAMUSCULAR | Status: DC | PRN
  Administered 2023-02-24: 50 ug via INTRAVENOUS
  Administered 2023-02-24: 100 ug via INTRAVENOUS

## 2023-02-24 MED ORDER — ONDANSETRON HCL 4 MG/2ML IJ SOLN
INTRAMUSCULAR | Status: AC
  Filled 2023-02-24: qty 2

## 2023-02-24 MED ORDER — EPHEDRINE 5 MG/ML INJ
INTRAVENOUS | Status: AC
  Filled 2023-02-24: qty 5

## 2023-02-24 MED ORDER — MIDAZOLAM HCL 2 MG/2ML IJ SOLN
INTRAMUSCULAR | Status: AC
  Filled 2023-02-24: qty 2

## 2023-02-24 MED ORDER — DEXAMETHASONE SODIUM PHOSPHATE 10 MG/ML IJ SOLN
INTRAMUSCULAR | Status: AC
  Filled 2023-02-24: qty 1

## 2023-02-24 MED ORDER — LACTATED RINGERS IV SOLN: INTRAVENOUS | Status: DC

## 2023-02-24 MED ORDER — CHLORHEXIDINE GLUCONATE CLOTH 2 % EX PADS: 6.0000 | MEDICATED_PAD | Freq: Once | CUTANEOUS | Status: DC

## 2023-02-24 MED ORDER — INSULIN ASPART 100 UNIT/ML IJ SOLN: 0.0000 [IU] | INTRAMUSCULAR | Status: DC | PRN

## 2023-02-24 MED ORDER — KETOROLAC TROMETHAMINE 30 MG/ML IJ SOLN
INTRAMUSCULAR | Status: AC
  Filled 2023-02-24: qty 1

## 2023-02-24 MED ORDER — PHENYLEPHRINE 80 MCG/ML (10ML) SYRINGE FOR IV PUSH (FOR BLOOD PRESSURE SUPPORT)
PREFILLED_SYRINGE | INTRAVENOUS | Status: DC | PRN
  Administered 2023-02-24 (×2): 160 ug via INTRAVENOUS

## 2023-02-24 MED ORDER — ACETAMINOPHEN 500 MG PO TABS
ORAL_TABLET | ORAL | Status: AC
  Administered 2023-02-24: 1000 mg via ORAL
  Filled 2023-02-24: qty 2

## 2023-02-24 MED ORDER — SUGAMMADEX SODIUM 200 MG/2ML IV SOLN
INTRAVENOUS | Status: DC | PRN
  Administered 2023-02-24: 240 mg via INTRAVENOUS

## 2023-02-24 MED ORDER — FENTANYL CITRATE (PF) 250 MCG/5ML IJ SOLN
INTRAMUSCULAR | Status: AC
  Filled 2023-02-24: qty 5

## 2023-02-24 MED ORDER — ESMOLOL HCL 100 MG/10ML IV SOLN
INTRAVENOUS | Status: AC
  Filled 2023-02-24: qty 10

## 2023-02-24 MED ORDER — ACETAMINOPHEN 500 MG PO TABS
1000.0000 mg | ORAL_TABLET | ORAL | Status: AC
  Filled 2023-02-24: qty 2

## 2023-02-24 MED ORDER — 0.9 % SODIUM CHLORIDE (POUR BTL) OPTIME
TOPICAL | Status: DC | PRN
  Administered 2023-02-24: 1000 mL

## 2023-02-24 MED ORDER — DEXAMETHASONE SODIUM PHOSPHATE 10 MG/ML IJ SOLN
INTRAMUSCULAR | Status: DC | PRN
  Administered 2023-02-24: 4 mg via INTRAVENOUS

## 2023-02-24 MED ORDER — CHLORHEXIDINE GLUCONATE 0.12 % MT SOLN
15.0000 mL | Freq: Once | OROMUCOSAL | Status: AC
  Administered 2023-02-24: 15 mL via OROMUCOSAL
  Filled 2023-02-24: qty 15

## 2023-02-24 MED ORDER — CEFAZOLIN SODIUM-DEXTROSE 2-4 GM/100ML-% IV SOLN
INTRAVENOUS | Status: AC
  Filled 2023-02-24: qty 100

## 2023-02-24 MED ORDER — ESMOLOL HCL 100 MG/10ML IV SOLN
INTRAVENOUS | Status: DC | PRN
  Administered 2023-02-24: 50 mg via INTRAVENOUS
  Administered 2023-02-24: 30 mg via INTRAVENOUS

## 2023-02-24 MED ORDER — LIDOCAINE 2% (20 MG/ML) 5 ML SYRINGE
INTRAMUSCULAR | Status: AC
  Filled 2023-02-24: qty 5

## 2023-02-24 MED ORDER — LIDOCAINE 2% (20 MG/ML) 5 ML SYRINGE
INTRAMUSCULAR | Status: DC | PRN
  Administered 2023-02-24: 40 mg via INTRAVENOUS

## 2023-02-24 MED ORDER — PROPOFOL 10 MG/ML IV BOLUS
INTRAVENOUS | Status: DC | PRN
  Administered 2023-02-24: 120 mg via INTRAVENOUS

## 2023-02-24 MED ORDER — ORAL CARE MOUTH RINSE: 15.0000 mL | Freq: Once | OROMUCOSAL | Status: AC

## 2023-02-24 MED ORDER — PROPOFOL 10 MG/ML IV BOLUS
INTRAVENOUS | Status: AC
  Filled 2023-02-24: qty 20

## 2023-02-24 MED ORDER — TRAMADOL HCL 50 MG PO TABS
50.0000 mg | ORAL_TABLET | Freq: Four times a day (QID) | ORAL | 0 refills | Status: AC | PRN
Start: 2023-02-24 — End: 2024-02-24

## 2023-02-24 MED ORDER — BUPIVACAINE HCL 0.25 % IJ SOLN
INTRAMUSCULAR | Status: DC | PRN
  Administered 2023-02-24: 3 mL

## 2023-02-24 SURGICAL SUPPLY — 41 items
ADH SKN CLS APL DERMABOND .7 (GAUZE/BANDAGES/DRESSINGS) ×1
BAG COUNTER SPONGE SURGICOUNT (BAG) ×1 IMPLANT
BAG SPNG CNTER NS LX DISP (BAG) ×1
CANISTER SUCT 3000ML PPV (MISCELLANEOUS) IMPLANT
COVER SURGICAL LIGHT HANDLE (MISCELLANEOUS) ×1 IMPLANT
DERMABOND ADVANCED .7 DNX12 (GAUZE/BANDAGES/DRESSINGS) ×1 IMPLANT
DISSECTOR BLUNT TIP ENDO 5MM (MISCELLANEOUS) IMPLANT
ELECT REM PT RETURN 9FT ADLT (ELECTROSURGICAL) ×1
ELECTRODE REM PT RTRN 9FT ADLT (ELECTROSURGICAL) ×1 IMPLANT
GLOVE BIO SURGEON STRL SZ7.5 (GLOVE) ×2 IMPLANT
GOWN STRL REUS W/ TWL LRG LVL3 (GOWN DISPOSABLE) ×2 IMPLANT
GOWN STRL REUS W/ TWL XL LVL3 (GOWN DISPOSABLE) ×1 IMPLANT
GOWN STRL REUS W/TWL LRG LVL3 (GOWN DISPOSABLE) ×2
GOWN STRL REUS W/TWL XL LVL3 (GOWN DISPOSABLE) ×1
IRRIG SUCT STRYKERFLOW 2 WTIP (MISCELLANEOUS)
IRRIGATION SUCT STRKRFLW 2 WTP (MISCELLANEOUS) IMPLANT
KIT BASIN OR (CUSTOM PROCEDURE TRAY) ×1 IMPLANT
KIT TURNOVER KIT B (KITS) ×1 IMPLANT
MESH 3DMAX 4X6 RT LRG (Mesh General) IMPLANT
NDL INSUFFLATION 14GA 120MM (NEEDLE) IMPLANT
NEEDLE INSUFFLATION 14GA 120MM (NEEDLE)
NS IRRIG 1000ML POUR BTL (IV SOLUTION) ×1 IMPLANT
PAD ARMBOARD 7.5X6 YLW CONV (MISCELLANEOUS) ×2 IMPLANT
RELOAD STAPLE 4.0 BLU F/HERNIA (INSTRUMENTS) IMPLANT
RELOAD STAPLE 4.8 BLK F/HERNIA (STAPLE) IMPLANT
RELOAD STAPLE HERNIA 4.0 BLUE (INSTRUMENTS)
RELOAD STAPLE HERNIA 4.8 BLK (STAPLE)
SCISSORS LAP 5X35 DISP (ENDOMECHANICALS) ×1 IMPLANT
SET TUBE SMOKE EVAC HIGH FLOW (TUBING) ×1 IMPLANT
STAPLER HERNIA 12 8.5 360D (INSTRUMENTS) IMPLANT
SUT MNCRL AB 4-0 PS2 18 (SUTURE) ×1 IMPLANT
SUT VIC AB 1 CT1 27 (SUTURE)
SUT VIC AB 1 CT1 27XBRD ANBCTR (SUTURE) IMPLANT
TOWEL GREEN STERILE (TOWEL DISPOSABLE) ×1 IMPLANT
TOWEL GREEN STERILE FF (TOWEL DISPOSABLE) ×1 IMPLANT
TRAY LAPAROSCOPIC MC (CUSTOM PROCEDURE TRAY) ×1 IMPLANT
TROCAR OPTICAL SHORT 5MM (TROCAR) ×1 IMPLANT
TROCAR OPTICAL SLV SHORT 5MM (TROCAR) ×1 IMPLANT
TROCAR Z THREAD OPTICAL 12X100 (TROCAR) ×1 IMPLANT
WARMER LAPAROSCOPE (MISCELLANEOUS) ×1 IMPLANT
WATER STERILE IRR 1000ML POUR (IV SOLUTION) ×1 IMPLANT

## 2023-02-24 NOTE — Anesthesia Postprocedure Evaluation (Signed)
Anesthesia Post Note  Patient: Dustin Allen  Procedure(s) Performed: LAPAROSCOPIC RIGHT  INGUINAL HERNIA REPAIR WITH MESH (Right: Abdomen)     Patient location during evaluation: PACU Anesthesia Type: General Level of consciousness: awake and alert Pain management: pain level controlled Vital Signs Assessment: post-procedure vital signs reviewed and stable Respiratory status: spontaneous breathing, nonlabored ventilation, respiratory function stable and patient connected to nasal cannula oxygen Cardiovascular status: blood pressure returned to baseline and stable Postop Assessment: no apparent nausea or vomiting Anesthetic complications: no  No notable events documented.  Last Vitals:  Vitals:   02/24/23 1315 02/24/23 1330  BP: (!) 159/92 (!) 149/95  Pulse: 77 69  Resp: 10 10  Temp:    SpO2: 97% 97%    Last Pain:  Vitals:   02/24/23 1330  TempSrc:   PainSc: 0-No pain                 Kennieth Rad

## 2023-02-24 NOTE — Interval H&P Note (Signed)
History and Physical Interval Note:  02/24/2023 11:32 AM  Dustin Allen  has presented today for surgery, with the diagnosis of RIGHT INGUINAL HERNIA.  The various methods of treatment have been discussed with the patient and family. After consideration of risks, benefits and other options for treatment, the patient has consented to  Procedure(s): LAPAROSCOPIC RIGHT  INGUINAL HERNIA REPAIR WITH MESH (Right) as a surgical intervention.  The patient's history has been reviewed, patient examined, no change in status, stable for surgery.  I have reviewed the patient's chart and labs.  Questions were answered to the patient's satisfaction.     Axel Filler

## 2023-02-24 NOTE — Discharge Instructions (Signed)

## 2023-02-24 NOTE — Anesthesia Procedure Notes (Signed)
Procedure Name: Intubation Date/Time: 02/24/2023 12:17 PM  Performed by: April Holding, CRNAPre-anesthesia Checklist: Patient identified, Emergency Drugs available, Suction available and Patient being monitored Patient Re-evaluated:Patient Re-evaluated prior to induction Oxygen Delivery Method: Circle system utilized Preoxygenation: Pre-oxygenation with 100% oxygen Induction Type: IV induction, Rapid sequence and Cricoid Pressure applied Ventilation: Mask ventilation without difficulty Laryngoscope Size: 3 and Mac Grade View: Grade II Tube type: Oral Tube size: 8.0 mm Number of attempts: 1 Airway Equipment and Method: Stylet and Oral airway Placement Confirmation: ETT inserted through vocal cords under direct vision, positive ETCO2 and breath sounds checked- equal and bilateral Secured at: 23 cm Tube secured with: Tape Dental Injury: Teeth and Oropharynx as per pre-operative assessment

## 2023-02-24 NOTE — Op Note (Signed)
02/24/2023  11:58 AM  PATIENT:  Sharen Heck  62 y.o. male  PRE-OPERATIVE DIAGNOSIS:  RIGHT INGUINAL HERNIA  POST-OPERATIVE DIAGNOSIS: RIGHT PANTALOON INGUINAL HERNIA  PROCEDURE:  Procedure(s): LAPAROSCOPIC RIGHT  INGUINAL HERNIA REPAIR WITH MESH (Right)  SURGEON:  Surgeons and Role:    Axel Filler, MD - Primary  ASSISTANTS: Patrici Ranks RNFA   ANESTHESIA:   local and general  EBL:  minimal   BLOOD ADMINISTERED:none  DRAINS: none   LOCAL MEDICATIONS USED:  BUPIVICAINE   SPECIMEN:  No Specimen  DISPOSITION OF SPECIMEN:  N/A  COUNTS:  YES  TOURNIQUET:  * No tourniquets in log *  DICTATION: .Dragon Dictation Counts: reported as correct x 2  Findings:  The patient had a pantaloon right indirect hernia and a moderated sized cord lipoma  Indications for procedure:  The patient is a 62 year old male with a right inguinal hernia for several months. Patient complained of symptomatology to his right inguinal area. The patient was taken back for elective inguinal hernia repair.  Details of the procedure: The patient was taken back to the operating room. The patient was placed in supine position with bilateral SCDs in place.  The patient was prepped and draped in the usual sterile fashion.  After appropriate anitbiotics were confirmed, a time-out was confirmed and all facts were verified.  0.25% Marcaine was used to infiltrate the umbilical area. A 11-blade was used to cut down the skin and blunt dissection was used to get the anterior fashion.  The anterior fascia was incised approximately 1 cm and the muscles were retracted laterally. Blunt dissection was then used to create a space in the preperitoneal area. At this time a 10 mm camera was then introduced into the space and advanced the pubic tubercle and a 12 mm trocar was placed over this and insufflation was started.  At this time and space was created from medial to laterally the preperitoneal space.  Cooper's  ligament was initially cleaned off.  There was a direct hernia with preperitoneal fat that was seen in the direct space .  Thus was reduced.  A hernia sac was identified in the indirect space as well . Dissection of the hernia sac and cord structures was undertaken the vas deferens was identified and protected in all parts of the case.    Once the hernia sac was taken down to approximately the umbilicus a Bard 3D Max mesh, size: Large, was  introduced into the preperitoneal space.  The mesh was brought over to cover the direct and indirect hernia spaces.  This was anchored into place and secured to Cooper's ligament with 4.4mm staples from a [place] hernia stapler. It was anchored to the anterior abdominal wall with 4.8 mm staples. The hernia sac was seen lying posterior to the mesh. There was no staples placed laterally. The insufflation was evacuated and the peritoneum was seen posterior to the mesh. The trochars were removed. The anterior fascia was reapproximated using #1 Vicryl on a UR- 6.  Intra-abdominal air was evacuated and the Veress needle removed. The skin was reapproximated using 4-0 Monocryl subcuticular fashion and Dermabond. The patient was awakened from general anesthesia and taken to recovery in stable condition.    PLAN OF CARE: Discharge to home after PACU  PATIENT DISPOSITION:  PACU - hemodynamically stable.   Delay start of Pharmacological VTE agent (>24hrs) due to surgical blood loss or risk of bleeding: not applicable

## 2023-02-24 NOTE — Transfer of Care (Addendum)
Immediate Anesthesia Transfer of Care Note  Patient: Dustin Allen  Procedure(s) Performed: LAPAROSCOPIC RIGHT  INGUINAL HERNIA REPAIR WITH MESH (Right: Abdomen)  Patient Location: PACU  Anesthesia Type:General  Level of Consciousness: awake, alert , and patient cooperative  Airway & Oxygen Therapy: Patient Spontanous Breathing and Patient connected to nasal cannula oxygen  Post-op Assessment: Report given to RN and Post -op Vital signs reviewed and stable  Post vital signs: Reviewed and stable  Last Vitals:  Vitals Value Taken Time  BP 169/101 02/24/23 1257  Temp    Pulse 84 02/24/23 1300  Resp 13 02/24/23 1300  SpO2 98 % 02/24/23 1300  Vitals shown include unfiled device data.  Last Pain:  Vitals:   02/24/23 1123  TempSrc: Oral  PainSc: 0-No pain         Complications: No notable events documented.

## 2023-02-24 NOTE — Progress Notes (Signed)
Pt states he had his Ozempic yesterday.  K is 5.9. Dr. Glade Stanford is aware.

## 2023-02-24 NOTE — Anesthesia Preprocedure Evaluation (Signed)
Anesthesia Evaluation  Patient identified by MRN, date of birth, ID band Patient awake    Reviewed: Allergy & Precautions, NPO status , Patient's Chart, lab work & pertinent test results  Airway Mallampati: II  TM Distance: >3 FB Neck ROM: Full    Dental  (+) Dental Advisory Given   Pulmonary neg pulmonary ROS   breath sounds clear to auscultation       Cardiovascular hypertension, Pt. on medications  Rhythm:Regular Rate:Normal     Neuro/Psych  Neuromuscular disease    GI/Hepatic negative GI ROS, Neg liver ROS,,,  Endo/Other  diabetes, Type 2    Renal/GU CRFRenal disease     Musculoskeletal  (+) Arthritis ,    Abdominal   Peds  Hematology negative hematology ROS (+)   Anesthesia Other Findings   Reproductive/Obstetrics                             Anesthesia Physical Anesthesia Plan  ASA: 2  Anesthesia Plan: General   Post-op Pain Management: Tylenol PO (pre-op)*   Induction: Intravenous and Rapid sequence  PONV Risk Score and Plan: 2 and Dexamethasone, Ondansetron and Treatment may vary due to age or medical condition  Airway Management Planned: Oral ETT  Additional Equipment:   Intra-op Plan:   Post-operative Plan: Extubation in OR  Informed Consent: I have reviewed the patients History and Physical, chart, labs and discussed the procedure including the risks, benefits and alternatives for the proposed anesthesia with the patient or authorized representative who has indicated his/her understanding and acceptance.     Dental advisory given  Plan Discussed with: CRNA  Anesthesia Plan Comments:        Anesthesia Quick Evaluation

## 2023-02-25 ENCOUNTER — Encounter (HOSPITAL_COMMUNITY): Payer: Self-pay | Admitting: General Surgery

## 2023-05-20 DIAGNOSIS — E1129 Type 2 diabetes mellitus with other diabetic kidney complication: Secondary | ICD-10-CM | POA: Diagnosis not present

## 2023-05-20 DIAGNOSIS — Z23 Encounter for immunization: Secondary | ICD-10-CM | POA: Diagnosis not present

## 2023-05-20 DIAGNOSIS — I129 Hypertensive chronic kidney disease with stage 1 through stage 4 chronic kidney disease, or unspecified chronic kidney disease: Secondary | ICD-10-CM | POA: Diagnosis not present

## 2023-08-23 DIAGNOSIS — L812 Freckles: Secondary | ICD-10-CM | POA: Diagnosis not present

## 2023-08-23 DIAGNOSIS — L720 Epidermal cyst: Secondary | ICD-10-CM | POA: Diagnosis not present

## 2023-08-23 DIAGNOSIS — Z85828 Personal history of other malignant neoplasm of skin: Secondary | ICD-10-CM | POA: Diagnosis not present

## 2023-08-23 DIAGNOSIS — L821 Other seborrheic keratosis: Secondary | ICD-10-CM | POA: Diagnosis not present

## 2023-11-30 DIAGNOSIS — E1129 Type 2 diabetes mellitus with other diabetic kidney complication: Secondary | ICD-10-CM | POA: Diagnosis not present

## 2023-11-30 DIAGNOSIS — Z125 Encounter for screening for malignant neoplasm of prostate: Secondary | ICD-10-CM | POA: Diagnosis not present

## 2023-11-30 DIAGNOSIS — E785 Hyperlipidemia, unspecified: Secondary | ICD-10-CM | POA: Diagnosis not present

## 2023-12-13 DIAGNOSIS — I129 Hypertensive chronic kidney disease with stage 1 through stage 4 chronic kidney disease, or unspecified chronic kidney disease: Secondary | ICD-10-CM | POA: Diagnosis not present

## 2023-12-13 DIAGNOSIS — Z0000 Encounter for general adult medical examination without abnormal findings: Secondary | ICD-10-CM | POA: Diagnosis not present

## 2023-12-13 DIAGNOSIS — Z1339 Encounter for screening examination for other mental health and behavioral disorders: Secondary | ICD-10-CM | POA: Diagnosis not present

## 2023-12-13 DIAGNOSIS — Z1331 Encounter for screening for depression: Secondary | ICD-10-CM | POA: Diagnosis not present

## 2023-12-13 DIAGNOSIS — R82998 Other abnormal findings in urine: Secondary | ICD-10-CM | POA: Diagnosis not present

## 2023-12-13 DIAGNOSIS — E1129 Type 2 diabetes mellitus with other diabetic kidney complication: Secondary | ICD-10-CM | POA: Diagnosis not present

## 2024-02-23 DIAGNOSIS — Z85828 Personal history of other malignant neoplasm of skin: Secondary | ICD-10-CM | POA: Diagnosis not present

## 2024-02-23 DIAGNOSIS — L720 Epidermal cyst: Secondary | ICD-10-CM | POA: Diagnosis not present

## 2024-02-23 DIAGNOSIS — L821 Other seborrheic keratosis: Secondary | ICD-10-CM | POA: Diagnosis not present

## 2024-02-23 DIAGNOSIS — L812 Freckles: Secondary | ICD-10-CM | POA: Diagnosis not present
